# Patient Record
Sex: Female | Born: 1960 | Race: White | Hispanic: No | Smoking: Former smoker
Health system: Southern US, Community
[De-identification: ages and names within clinical notes are randomized; demographics above are authoritative.]

## PROBLEM LIST (undated history)

## (undated) DIAGNOSIS — F329 Major depressive disorder, single episode, unspecified: Secondary | ICD-10-CM

## (undated) DIAGNOSIS — K219 Gastro-esophageal reflux disease without esophagitis: Secondary | ICD-10-CM

## (undated) DIAGNOSIS — I639 Cerebral infarction, unspecified: Secondary | ICD-10-CM

## (undated) DIAGNOSIS — I509 Heart failure, unspecified: Secondary | ICD-10-CM

## (undated) DIAGNOSIS — J449 Chronic obstructive pulmonary disease, unspecified: Secondary | ICD-10-CM

## (undated) DIAGNOSIS — R079 Chest pain, unspecified: Secondary | ICD-10-CM

## (undated) DIAGNOSIS — F419 Anxiety disorder, unspecified: Secondary | ICD-10-CM

## (undated) DIAGNOSIS — F32A Depression, unspecified: Secondary | ICD-10-CM

## (undated) DIAGNOSIS — E785 Hyperlipidemia, unspecified: Secondary | ICD-10-CM

## (undated) DIAGNOSIS — I340 Nonrheumatic mitral (valve) insufficiency: Secondary | ICD-10-CM

## (undated) HISTORY — DX: Anxiety disorder, unspecified: F41.9

## (undated) HISTORY — DX: Chest pain, unspecified: R07.9

## (undated) HISTORY — DX: Cerebral infarction, unspecified: I63.9

## (undated) HISTORY — DX: Nonrheumatic mitral (valve) insufficiency: I34.0

## (undated) HISTORY — DX: Depression, unspecified: F32.A

## (undated) HISTORY — DX: Gastro-esophageal reflux disease without esophagitis: K21.9

## (undated) HISTORY — PX: CORONARY ANGIOPLASTY WITH STENT PLACEMENT: SHX49

## (undated) HISTORY — DX: Major depressive disorder, single episode, unspecified: F32.9

## (undated) HISTORY — PX: TOTAL VAGINAL HYSTERECTOMY: SHX2548

## (undated) HISTORY — DX: Chronic obstructive pulmonary disease, unspecified: J44.9

## (undated) HISTORY — DX: Hyperlipidemia, unspecified: E78.5

## (undated) HISTORY — DX: Heart failure, unspecified: I50.9

---

## 2004-07-28 ENCOUNTER — Ambulatory Visit: Payer: Self-pay | Admitting: Family Medicine

## 2004-11-15 ENCOUNTER — Ambulatory Visit: Payer: Self-pay | Admitting: Family Medicine

## 2005-01-08 ENCOUNTER — Ambulatory Visit: Payer: Self-pay | Admitting: Family Medicine

## 2005-03-02 ENCOUNTER — Ambulatory Visit: Payer: Self-pay | Admitting: Family Medicine

## 2005-03-23 ENCOUNTER — Ambulatory Visit: Payer: Self-pay | Admitting: Family Medicine

## 2005-05-22 ENCOUNTER — Ambulatory Visit: Payer: Self-pay | Admitting: Family Medicine

## 2005-08-06 ENCOUNTER — Ambulatory Visit: Payer: Self-pay | Admitting: Family Medicine

## 2005-08-28 ENCOUNTER — Ambulatory Visit: Payer: Self-pay | Admitting: Family Medicine

## 2005-09-04 ENCOUNTER — Ambulatory Visit: Payer: Self-pay | Admitting: Family Medicine

## 2005-09-06 ENCOUNTER — Ambulatory Visit: Payer: Self-pay | Admitting: Family Medicine

## 2005-09-10 ENCOUNTER — Ambulatory Visit: Payer: Self-pay | Admitting: Family Medicine

## 2005-09-19 ENCOUNTER — Ambulatory Visit: Payer: Self-pay | Admitting: Family Medicine

## 2005-10-16 ENCOUNTER — Ambulatory Visit: Payer: Self-pay | Admitting: Family Medicine

## 2005-10-30 ENCOUNTER — Ambulatory Visit: Payer: Self-pay | Admitting: Family Medicine

## 2007-05-21 ENCOUNTER — Encounter: Admission: RE | Admit: 2007-05-21 | Discharge: 2007-05-21 | Payer: Self-pay | Admitting: Neurology

## 2010-02-23 ENCOUNTER — Ambulatory Visit: Payer: Self-pay | Admitting: Thoracic Surgery (Cardiothoracic Vascular Surgery)

## 2010-09-27 ENCOUNTER — Inpatient Hospital Stay (HOSPITAL_COMMUNITY)
Admission: EM | Admit: 2010-09-27 | Discharge: 2010-09-29 | Payer: Self-pay | Source: Home / Self Care | Attending: Neurology | Admitting: Neurology

## 2010-09-28 LAB — HEMOGLOBIN A1C: Mean Plasma Glucose: 117 mg/dL — ABNORMAL HIGH (ref <117)

## 2010-09-28 LAB — GLUCOSE, CAPILLARY
Glucose-Capillary: 116 mg/dL — ABNORMAL HIGH (ref 70–99)
Glucose-Capillary: 181 mg/dL — ABNORMAL HIGH (ref 70–99)
Glucose-Capillary: 86 mg/dL (ref 70–99)
Glucose-Capillary: 93 mg/dL (ref 70–99)

## 2010-09-28 LAB — MRSA PCR SCREENING: MRSA by PCR: NEGATIVE

## 2010-09-28 LAB — COMPREHENSIVE METABOLIC PANEL
Alkaline Phosphatase: 75 U/L (ref 39–117)
BUN: 7 mg/dL (ref 6–23)
CO2: 28 mEq/L (ref 19–32)
Chloride: 100 mEq/L (ref 96–112)
Creatinine, Ser: 1.09 mg/dL (ref 0.4–1.2)
GFR calc non Af Amer: 53 mL/min — ABNORMAL LOW (ref >60)
Glucose, Bld: 119 mg/dL — ABNORMAL HIGH (ref 70–99)
Total Bilirubin: 0.4 mg/dL (ref 0.3–1.2)

## 2010-09-28 LAB — CBC
MCV: 84 fL (ref 78.0–100.0)
Platelets: 325 10*3/uL (ref 150–400)
RBC: 4.55 MIL/uL (ref 3.87–5.11)
WBC: 11.3 10*3/uL — ABNORMAL HIGH (ref 4.0–10.5)

## 2010-09-28 LAB — DRUGS OF ABUSE SCREEN W/O ALC, ROUTINE URINE
Amphetamine Screen, Ur: NEGATIVE
Benzodiazepines.: NEGATIVE
Marijuana Metabolite: NEGATIVE
Opiate Screen, Urine: NEGATIVE
Phencyclidine (PCP): NEGATIVE
Propoxyphene: NEGATIVE

## 2010-09-28 LAB — PROTIME-INR
INR: 0.93 (ref 0.00–1.49)
Prothrombin Time: 12.7 seconds (ref 11.6–15.2)

## 2010-09-28 LAB — DIFFERENTIAL
Basophils Absolute: 0 10*3/uL (ref 0.0–0.1)
Eosinophils Absolute: 0.2 10*3/uL (ref 0.0–0.7)
Lymphs Abs: 3.3 10*3/uL (ref 0.7–4.0)
Neutrophils Relative %: 61 % (ref 43–77)

## 2010-09-28 LAB — URINALYSIS, ROUTINE W REFLEX MICROSCOPIC
Ketones, ur: NEGATIVE mg/dL
Protein, ur: NEGATIVE mg/dL
Urobilinogen, UA: 0.2 mg/dL (ref 0.0–1.0)

## 2010-09-28 LAB — LIPID PANEL
Cholesterol: 134 mg/dL (ref 0–200)
Total CHOL/HDL Ratio: 3.7 RATIO

## 2010-09-28 LAB — CK TOTAL AND CKMB (NOT AT ARMC): Relative Index: 1.6 (ref 0.0–2.5)

## 2010-09-28 LAB — APTT: aPTT: 26 seconds (ref 24–37)

## 2010-09-28 LAB — POCT I-STAT, CHEM 8
Calcium, Ion: 0.95 mmol/L — ABNORMAL LOW (ref 1.12–1.32)
Chloride: 101 mEq/L (ref 96–112)
HCT: 39 % (ref 36.0–46.0)
Potassium: 2.7 mEq/L — CL (ref 3.5–5.1)

## 2010-09-29 LAB — BASIC METABOLIC PANEL
Chloride: 114 mEq/L — ABNORMAL HIGH (ref 96–112)
GFR calc Af Amer: >60 mL/min (ref >60)
GFR calc non Af Amer: >60 mL/min (ref >60)
Potassium: 3.4 mEq/L — ABNORMAL LOW (ref 3.5–5.1)
Sodium: 144 mEq/L (ref 135–145)

## 2010-09-29 LAB — MAGNESIUM: Magnesium: 1.7 mg/dL (ref 1.5–2.5)

## 2010-09-29 LAB — CBC
MCV: 85.9 fL (ref 78.0–100.0)
Platelets: 248 10*3/uL (ref 150–400)
RBC: 3.55 MIL/uL — ABNORMAL LOW (ref 3.87–5.11)
RDW: 12.9 % (ref 11.5–15.5)
WBC: 8.6 10*3/uL (ref 4.0–10.5)

## 2010-09-29 LAB — GLUCOSE, CAPILLARY
Glucose-Capillary: 84 mg/dL (ref 70–99)
Glucose-Capillary: 104 mg/dL — ABNORMAL HIGH (ref 70–99)

## 2010-10-07 NOTE — Discharge Summary (Signed)
[name], [name]                 ACCOUNT NO.:  [id number]  MEDICAL RECORD NO.:  [id number]          PATIENT TYPE:  INP  LOCATION:  3105                         FACILITY:  [facility]  PHYSICIAN:  Pramod P. Pearlean Brownie, MD    DATE OF BIRTH:  October 26, 1960  DATE OF ADMISSION:  09/27/2010 DATE OF DISCHARGE:  09/29/2010                              DISCHARGE SUMMARY   DIAGNOSES AT TIME OF DISCHARGE: 1. 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 2. Cigarette smoker. 3. Chronic obstructive pulmonary disease. 4. Coronary artery disease status post multiple cardiac stents as well     as open heart surgery in June 2011. 5. Dyslipidemia.  MEDICINES AT TIME OF DISCHARGE: 1. Plavix 75 mg a day. 2. Crestor 20 mg a day. 3. Dexalone 1 capsule a day. 4. Furosemide 40 mg 1 tablet q.a.m., 1/2 tablet q.p.m. 5. Lorazepam 0.5 mg daily as needed for anxiety. 6. Metoprolol XL 50 mg b.i.d. 7. Norvasc 5 mg a day. 8. Potassium 20 mEq 1 tablet a day. 9. Remeron 45 mg 1 tablet at bedtime. 10.Tramadol 50 mg 1-2 tablets q.4 h. p.r.n. pain. 11.Trazodone 50 one tablet daily at bedtime. 12.Zetia 10 mg a day.  STUDIES PERFORMED: 1. CT of the brain on admission shows no acute abnormality.  MRI of     the brain shows right medullary tiny infarct.  Mild white matter     type changes secondary to small vessel disease. 2. MRA of the head shows moderate intracranial vessel branch     irregularity. 3. Followup CT 24 hours after t-PA shows no acute abnormality. 4. 2D echocardiogram shows EF of 50-55% with no obvious source of     embolus. 5. Carotid Doppler shows no ICA stenosis. 6. EKG shows sinus rhythm.  LABORATORY STUDIES:  Magnesium 1.7.  Chemistry with potassium 3.4, chloride 114, BUN 4, creatinine 0.7.  CBC with hemoglobin 10.5, otherwise normal hemoglobin A1c 5.7.  Urine drug screen negative.  MRSA screening negative.  Cholesterol 134, triglycerides 96,  HDL 36, LDL 79. Urinalysis negative.  HISTORY OF PRESENT ILLNESS:  Ms. [name] is a 50 year old right- handed Caucasian female with history of coronary artery disease, COPD, and hyperlipidemia.  She had acute onset of left arm and leg weakness and slurred speech on the morning of that admission at 10:30 p.m.  The patient was awake, watching TV when she felt her left side of her face drawling.  She lives alone.  She called EMS who came to evaluate her and brought her to [facility] for further evaluation.  Upon arrival, CT was negative for acute abnormality.  She was felt to be a candidate for IV t-PA and full-dose IV t-PA was administered.  She was admitted to the neuro ICU for further evaluation.  HOSPITAL COURSE:  MRI of the brain showed an acute very small right medullary infarct felt to be secondary to small vessel disease.  She was on aspirin prior to admission and changed to Plavix for secondary stroke prevention.  She has risk factors, which are managed  by her primary care physician.  No other medication adjustments were made.  She was evaluated by PT, OT, and speech therapy who recommended home health with transition to outpatient neuro rehab.  Arrangements made and the patient will be discharged home.  CONDITION AT TIME OF DISCHARGE:  The patient alert and oriented to person, place, and situation.  She has mild dysarthria with her left facial droop.  Her eye movements are full.  Her face has progressed to be symmetric.  By time of discharge, she has no drift in her extremities, no focal weakness with direct strength testing though she does have a slight ataxic gait.  Plantars are down bilaterally.  DISCHARGE PLAN: 1. Discharge home alone. 2. Home health PT, OT, and social worker with planned transition to     outpatient neuro rehab as needed. 3. Plavix for secondary stroke prevention. 4. Ongoing risk factor control by primary care physician. 5. Follow up  primary care physician within 1 month. 6. Follow up Dr. Pearlean Brownie in his office in 2-3 months.     Annie Main, N.P.   ______________________________ Sunny Schlein. Pearlean Brownie, MD    SB/MEDQ  D:  09/29/2010  T:  09/30/2010  Job:  [id number]  cc:   Molly Maduro L. Lorita Officer., MD  Electronically Signed by Annie Main N.P. on 10/02/2010 81:19:14 PM Electronically Signed by Delia Heady MD on 10/07/2010 01:13:28 PM

## 2010-11-06 NOTE — H&P (Signed)
[name], [name]                 ACCOUNT NO.:  [id number]  MEDICAL RECORD NO.:  [id number]          PATIENT TYPE:  INP  LOCATION:  3105                         FACILITY:  [facility]  PHYSICIAN:  Joycelyn Schmid, MD   DATE OF BIRTH:  13-Sep-1960  DATE OF ADMISSION:  09/27/2010 DATE OF DISCHARGE:                             HISTORY & PHYSICAL   CHIEF COMPLAINT:  Left-sided weakness.  CODE STROKE LOG:  Last seen normal 2230.  Code stroke activated 2347. The patient arrival 2355.  Initial ED physician examination 2355. Stroke Team arrival 0008.  Initial NIH stroke scale 5.  Repeat NIH stroke scale prior to t-PA 4.  Neurology initial examination 0040.  IV t- PA was given at 0045.  HISTORY OF PRESENT ILLNESS:  This is a 50 year old female with history of coronary artery disease, COPD, hyperlipidemia, with new onset of left arm and leg weakness and slurred speech starting at 10:30 p.m.  The patient was awake, watching TV when she felt her left side of her face drawing up.  She lives alone.  She called the paramedics who came to evaluate her, brought her to the [facility] for further evaluation.  The patient had focal neurologic deficits.  No contraindications for IV t-PA.  Risks and benefits were explained the patient.  Full dose t-PA was given.  PAST MEDICAL HISTORY: 1. COPD. 2. Coronary artery disease status post multiple cardiac stents as well     as open heart bypass surgery in June 2011. 3. Hyperlipidemia.  MEDICATIONS:  The patient takes aspirin, Crestor, Toprol.  Additional medications; we will get the patient's home medication list and add to the chart later.  ALLERGIES: 1. SCOPOLAMINE. 2. FLAGYL.  FAMILY HISTORY:  Father deceased from stroke.  Mother deceased from cancer.  SOCIAL HISTORY:  She lives alone.  She smokes 1 pack cigarettes per day for the past 30 years.  Denies alcohol or illicit drug use.  REVIEW OF SYSTEMS:  Weakness, numbness, slurred  speech as per HPI. Denies chest pain, shortness of breath, abdominal pain, nausea, vomiting, constipation, diarrhea, fevers, or chills.  Other review of systems are negative.  PHYSICAL EXAMINATION:  VITAL SIGNS:  Temperature 98.7, blood pressure 144/87, heart rate 105, respirations 22, and O2 sat 98%. GENERAL:  Awake and alert.  Language is fluent.  Comprehension intact. She has mild dysarthria.  Pupils reactive and are 2 mm.  Visual fields full to confrontation. Extraocular muscles intact.  Facial sensation and strength are symmetric.  Uvula midline.  Shoulder shrug symmetric. Tongue is midline.  Motor examination:  Normal bulk and tone with the right upper and right lower extremity 5/5 strength.  In the left upper and left lower extremity, she has drift of the left arm and left leg. She has decreased strength throughout the left arm and left leg ranging from 2-3/5.  Sensory examination:  Decreased pinprick sensation on the left arm, symmetric in the face and legs.  Temperature and vibration symmetric.  Coordination testing:  Finger-nose-finger in the right upper extremity and right lower extremity normal.  Left upper and left lower extremity limited by weakness.  Reflexes 1+ throughout except for left biceps, brachioradialis slightly increased to 2. CARDIOVASCULAR:  Regular rate and rhythm.  No murmurs.  No carotid bruits.  My total initial NIH stroke scale prior to IV t-PA is 4; one point for left arm drift, one point for left leg drift, one point for decreased sensation, and one point for dysarthria.  LABORATORY TESTING:  Sodium 139, potassium 2.7, BUN 8, creatinine 1.3, and glucose 117.  White count 11.3 and platelets 325.  PT 12.7, INR of 0.9, and PTT 26.  CT scan of head which I reviewed shows no evidence of intracerebral hemorrhage.  No early ischemic changes.  ASSESSMENT AND RECOMMENDATIONS:  Ms. Hulett is a 50 year old female with coronary artery disease, chronic  obstructive pulmonary disease, hyperlipidemia, smoking with new focal left arm and leg weakness with slurred speech.  Suspect small vessel thrombosis resulting in stroke in the right subcortical region, right pons or medullary region.  The patient is status post full dose IV t-PA.  PLAN: 1. Admit to neuro ICU 3100. 2. Strict blood pressure control. 3. CT scan of head within 24 hours of IV t-PA. 4. Fasting lipid profile and lipid control. 5. Transthoracic echocardiogram, carotid ultrasound, MRI, MRA of the     head. 6. Replace potassium. 7. Stroke order set completed.  I spent 1 hour in evaluation of this critically ill patient, supervised administration of IV t-PA as well as bringing the patient to neuro ICU. I have reviewed imaging studies, labs, records myself and discussed the care and coordination with other healthcare providers.     Joycelyn Schmid, MD     VP/MEDQ  D:  09/28/2010  T:  09/28/2010  Job:  [id number]  Electronically Signed by Joycelyn Schmid  on 11/06/2010 12:20:25 PM

## 2012-11-29 ENCOUNTER — Telehealth: Payer: Self-pay | Admitting: *Deleted

## 2012-11-29 NOTE — Telephone Encounter (Signed)
Spoke to the patient to cancel appointment on 12-10-12. Will call patient back to reschedule next week

## 2012-12-02 ENCOUNTER — Encounter: Payer: Self-pay | Admitting: *Deleted

## 2012-12-02 NOTE — Telephone Encounter (Signed)
Left message on patient voicemail stating that her new appointment is going to 12-23-12 at 115 with CM. If patient can not make this appointment she needs to call the office and r/s.

## 2012-12-02 NOTE — Telephone Encounter (Signed)
error 

## 2012-12-10 ENCOUNTER — Ambulatory Visit: Payer: Self-pay | Admitting: Nurse Practitioner

## 2012-12-30 ENCOUNTER — Encounter: Payer: Self-pay | Admitting: Nurse Practitioner

## 2012-12-30 ENCOUNTER — Ambulatory Visit (INDEPENDENT_AMBULATORY_CARE_PROVIDER_SITE_OTHER): Payer: Medicaid Other | Admitting: Nurse Practitioner

## 2012-12-30 DIAGNOSIS — I6529 Occlusion and stenosis of unspecified carotid artery: Secondary | ICD-10-CM

## 2012-12-30 DIAGNOSIS — I6350 Cerebral infarction due to unspecified occlusion or stenosis of unspecified cerebral artery: Secondary | ICD-10-CM

## 2012-12-30 NOTE — Progress Notes (Signed)
HPI: Patient returns for followup after her last visit 06/12/2012. [name] Risk factors at that time were smoking she has now quit,  Hypertension, heart disease and hyperlipidemia. She has had no further stroke or TIA symptoms since last seen, she has not fallen. 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 She is to begin cardiac rehabilitation. No new neurologic complaints   ROS:  weight gain, memory, numbness, anxiety, decreased engery  Physical Exam General: well developed, well nourished, seated, in no evident distress Head: head normocephalic and atraumatic. Oropharynx benign Neck: supple with no carotid or supraclavicular bruits Cardiovascular: regular rate and rhythm, no murmurs, CABG scar mid chest  Neurologic Exam Mental Status: Awake and fully alert. Oriented to place and time. Recent and remote memory intact. Attention span, concentration and fund of knowledge appropriate. Mood and affect appropriate.  Cranial Nerves: Fundoscopic exam reveals sharp disc margins. Pupils equal, briskly reactive to light. Extraocular movements full without nystagmus. Visual fields full to confrontation. Hearing intact and symmetric to finger snap. Facial sensation intact. Face, tongue, palate move normally and symmetrically. Neck flexion and extension normal.  Motor: Normal bulk and tone. Normal strength in all tested extremity muscles.No focal weakness, no drift Sensory.: intact to touch and pinprick and vibratory.  Coordination: Rapid alternating movements normal in all extremities. Finger-to-nose and heel-to-shin performed accurately bilaterally. Gait and Station: Arises from chair without  difficulty. Stance is normal. Gait demonstrates normal stride length and balance . Able to heel, toe and unsteady with tandem walk .  Reflexes: 2+ and symmetric. Toes downgoing.     ASSESSMENT: History of right medullary infarct in January 2012 likely from small vessel disease treated with IV TPA with good recovery. Risk factors of smoking, hypertension, heart disease and hyperlipidemia.     PLAN: Continue Plavix for secondary stroke prevention Strict control of blood pressure with systolic goal below 130 Control of lipids with LDL goal below 100 Congratulated on continued smoking cessation Will repeat  carotid Doppler in Jan 2015 given a copy of most recent Doppler study done 09/26/2012 which was consistent with 50-69% stenosis involving the left proximal ICA.  Followup in 6 months  Nilda Riggs, [facility] APRN

## 2012-12-30 NOTE — Patient Instructions (Addendum)
Continue Plavix for secondary stroke prevention Strict control of blood pressure with systolic goal below 130 Control of lipids with LDL goal below 100 Congratulated on continued smoking cessation Will repeat TCD and carotid Doppler in Jan 2015 given a copy of most recent Doppler study done 09/26/2012 Followup in 6 months

## 2013-02-16 ENCOUNTER — Other Ambulatory Visit: Payer: Self-pay

## 2013-02-16 MED ORDER — CLOPIDOGREL BISULFATE 75 MG PO TABS: 75.0000 mg | ORAL_TABLET | Freq: Every day | ORAL | Status: DC

## 2013-07-01 ENCOUNTER — Ambulatory Visit (INDEPENDENT_AMBULATORY_CARE_PROVIDER_SITE_OTHER): Payer: Medicare Other | Admitting: Nurse Practitioner

## 2013-07-01 ENCOUNTER — Encounter: Payer: Self-pay | Admitting: Nurse Practitioner

## 2013-07-01 DIAGNOSIS — I6529 Occlusion and stenosis of unspecified carotid artery: Secondary | ICD-10-CM

## 2013-07-01 NOTE — Patient Instructions (Signed)
Will schedule for Doppler and TEE and January Continue Plavix for secondary stroke prevention Strict control of blood pressure with systolic goal below 130 LDL goal below 100 Followup in 6 months

## 2013-07-01 NOTE — Progress Notes (Signed)
[facility]  PATIENT: [name] DOB: [birth year]   REASON FOR VISIT: Followup for right medullary infarct January 2012   HISTORY OF PRESENT ILLNESS: [name], 52 year old female returns for followup .[name] Risk factors at that time were smoking she has now quit, Hypertension, heart disease and hyperlipidemia. She has had no further stroke or TIA symptoms since last seen, she has not fallen. 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 She has concluded cardiac rehabilitation and is now going to cardiac wellness. She remains on Plavix with minimal bruising. She does complain of memory loss, check baseline Mini-Mental Status exam today    REVIEW OF SYSTEMS: Full 14 system review of systems performed and notable only for:  Constitutional: N/A  Cardiovascular: Palpitations  Ear/Nose/Throat: N/A  Skin: N/A  Eyes: N/A  Respiratory: Shortness of breath Gastroitestinal: N/A  Hematology/Lymphatic: Easy bruising Endocrine: N/A Musculoskeletal:N/A  Allergy/Immunology: N/A  Neurological: Memory loss Psychiatric depression, anxiety, decreased energy, disinterest in activities  ALLERGIES: Allergies  Allergen Reactions  . Flagyl [Metronidazole] Rash  . Scopolamine   . Zithromax [Azithromycin] Rash    HOME MEDICATIONS: Outpatient Prescriptions Prior to Visit  Medication Sig Dispense Refill  . albuterol (PROVENTIL HFA;VENTOLIN HFA) 108 (90 BASE) MCG/ACT inhaler Inhale 2 puffs into the lungs every 6 (six) hours as needed for wheezing.      Marland Kitchen aspirin (ASPIR-81) 81 MG EC tablet Take 81 mg by mouth daily. Swallow whole.      . clopidogrel (PLAVIX) 75 MG tablet Take  1 tablet (75 mg total) by mouth daily.  90 tablet  1  . diltiazem (CARDIZEM) 120 MG tablet Take 120 mg by mouth daily.      . formoterol (FORADIL AEROLIZER) 12 MCG capsule for inhaler Place 12 mcg into inhaler and inhale 2 (two) times daily.      . furosemide (LASIX) 40 MG tablet Take 40 mg by mouth daily. One tab po every am and 1/2 tab in the pm      . METOPROLOL TARTRATE PO Take by mouth.      . niacin (NIASPAN) 500 MG CR tablet Take 500 mg by mouth at bedtime.      . potassium chloride (KLOR-CON) 20 MEQ packet Take 20 mEq by mouth daily.      . rosuvastatin (CRESTOR) 20 MG tablet Take 20 mg by mouth daily.      Marland Kitchen tiotropium (SPIRIVA HANDIHALER) 18 MCG inhalation capsule Place 18 mcg into inhaler and inhale daily.      . traZODone (DESYREL) 50 MG tablet Take 50 mg by mouth at bedtime.      Marland Kitchen LORazepam (ATIVAN) 0.5 MG tablet Take 0.5 mg by mouth as needed for anxiety.      . mirtazapine (REMERON) 45 MG tablet Take 45 mg by mouth at bedtime.      Marland Kitchen omeprazole (PRILOSEC) 20 MG capsule Take 20 mg by mouth daily.       No facility-administered medications prior to visit.    PAST MEDICAL HISTORY: Past Medical History  Diagnosis Date  . Depression   . Anxiety   . COPD (chronic obstructive pulmonary disease)   . Hyperlipemia   . GERD (gastroesophageal reflux disease)   .  MI (mitral incompetence)   . CHF (congestive heart failure)   . Stroke     PAST SURGICAL HISTORY: Past Surgical History  Procedure Laterality Date  . Total vaginal hysterectomy    . Coronary angioplasty with stent placement      FAMILY HISTORY: History reviewed. No pertinent family history.  SOCIAL HISTORY: History   Social History  . Marital Status: Divorced    Spouse Name: N/A    Number of Children: N/A  . Years of Education: N/A   Occupational History  . Not on file.   Social History Main Topics  . Smoking status: Former Games developer  . Smokeless tobacco: Never Used  . Alcohol Use: No  . Drug Use: No    . Sexual Activity: Not on file   Other Topics Concern  . Not on file   Social History Narrative  . No narrative on file     PHYSICAL EXAM  Filed Vitals:   07/01/13 1418  BP: 109/73  Pulse: 78  Height: 5\' 4"  (1.626 m)  Weight: 148 lb (67.132 kg)   Body mass index is 25.39 kg/(m^2).  Generalized: Well developed, in no acute distress  Head: normocephalic and atraumatic,. Oropharynx benign  Neck: Supple, no carotid bruits  Cardiac: Regular rate rhythm, no murmur , CABG scar mid chest Neurological examination   Mentation: Alert oriented to time, place, history taking. MMSE 29/30 missing 1 recall item. Follows all commands speech and language fluent  Cranial nerve II-XII: Fundoscopic exam reveals sharp disc margins.Pupils were equal round reactive to light extraocular movements were full, visual field were full on confrontational test. Facial sensation and strength were normal. hearing was intact to finger rubbing bilaterally. Uvula tongue midline. head turning and shoulder shrug and were normal and symmetric.Tongue protrusion into cheek strength was normal. Motor: normal bulk and tone, full strength in the BUE, BLE, fine finger movements normal, no pronator drift. No focal weakness Sensory: normal and symmetric to light touch, pinprick, and  vibration  Coordination: finger-nose-finger, heel-to-shin bilaterally, no dysmetria Reflexes: Brachioradialis 2/2, biceps 2/2, triceps 2/2, patellar 2/2, Achilles 2/2, plantar responses were flexor bilaterally. Gait and Station: Rising up from seated position without assistance, normal stance, moderate stride, good arm swing, smooth turning, able to perform tiptoe, and heel walking without difficulty. Unsteady with tandem walk.  DIAGNOSTIC DATA (LABS, IMAGING, TESTING) - None to review   ASSESSMENT AND PLAN  52 y.o. year old female  has a past medical history of Depression; Anxiety; COPD (chronic obstructive pulmonary disease);  Hyperlipemia;  MI (mitral incompetence); CHF (congestive heart failure); and Stroke. here to follow up.   Will schedule for Doppler and TCD for  January Continue Plavix for secondary stroke prevention Strict control of blood pressure with systolic goal below 130 LDL goal below 100 Followup in 6 months Nilda Riggs, [facility], [facility], APRN  [facility] [address], [address] [place], [place] [address] [phone]

## 2013-09-06 ENCOUNTER — Other Ambulatory Visit: Payer: Self-pay

## 2013-09-06 MED ORDER — CLOPIDOGREL BISULFATE 75 MG PO TABS: 75.0000 mg | ORAL_TABLET | Freq: Every day | ORAL | Status: DC

## 2013-09-08 ENCOUNTER — Ambulatory Visit (INDEPENDENT_AMBULATORY_CARE_PROVIDER_SITE_OTHER): Payer: Medicare Other

## 2013-09-08 DIAGNOSIS — Z0289 Encounter for other administrative examinations: Secondary | ICD-10-CM

## 2013-09-08 DIAGNOSIS — I6529 Occlusion and stenosis of unspecified carotid artery: Secondary | ICD-10-CM

## 2013-09-10 ENCOUNTER — Telehealth: Payer: Self-pay | Admitting: Nurse Practitioner

## 2013-09-10 NOTE — Telephone Encounter (Signed)
TC to patient with results of doppler. Left message that results were negative for significant stenosis.

## 2013-09-11 ENCOUNTER — Telehealth: Payer: Self-pay | Admitting: Nurse Practitioner

## 2013-09-11 NOTE — Telephone Encounter (Signed)
Called patient and shared normal TCD results, she verbalized understanding

## 2013-09-11 NOTE — Telephone Encounter (Signed)
Please let pt know TCD was normal

## 2013-12-03 ENCOUNTER — Other Ambulatory Visit: Payer: Self-pay

## 2013-12-03 MED ORDER — CLOPIDOGREL BISULFATE 75 MG PO TABS: 75.0000 mg | ORAL_TABLET | Freq: Every day | ORAL | Status: DC

## 2013-12-30 ENCOUNTER — Ambulatory Visit (INDEPENDENT_AMBULATORY_CARE_PROVIDER_SITE_OTHER): Payer: Medicare Other | Admitting: Neurology

## 2013-12-30 ENCOUNTER — Encounter: Payer: Self-pay | Admitting: Neurology

## 2013-12-30 ENCOUNTER — Encounter (INDEPENDENT_AMBULATORY_CARE_PROVIDER_SITE_OTHER): Payer: Self-pay

## 2013-12-30 VITALS — BP 114/70 | HR 85 | Ht 63.0 in | Wt 149.0 lb

## 2013-12-30 DIAGNOSIS — R413 Other amnesia: Secondary | ICD-10-CM

## 2013-12-30 NOTE — Patient Instructions (Signed)
I had a long discussion the patient with regards to her cerebrovascular disease and mild memory loss and answered questions.Both appear to be stable at present time. Continue Plavix for stroke prevention and strict control of hypertension with blood pressure goal below 130/90 lipids with LDL cholesterol goal below 100 mg percent. I advised her to do next with the exercises to help with discussed with and neck pain and headache. Return for followup in 6 months with Darrol Angelarolyn Martin, NP. Or call earlier if necessary.

## 2013-12-31 NOTE — Progress Notes (Addendum)
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 Risk factors at that time were smoking she has now quit, Hypertension, heart disease and hyperlipidemia. She has had no further stroke or TIA symptoms since last seen, she has not fallen. 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 She has concluded cardiac rehabilitation and is now going to cardiac wellness. She remains on Plavix with minimal bruising. She does complain of memory loss, check baseline Mini-Mental Status exam today Update 12/30/2013 : She returns for followup last visit 6 months ago. She continues to well without recurrent stroke or TIA symptoms. She is tolerating Plavix well without bleeding bruising or other side effects. She states her blood pressure is quite well controlled and it was 114/70 office today. Her last lipid profile was a month ago and was fine and checked by a cardiologist. She also states her memory difficulties are unchanged. She had carotid ultrasound and transcranial doppler studies done on 09/08/13 which were both unremarkable. She feels her balance has not yet returned back to normal but she walks quite well with a cane which he uses only for low dose. She has some mild intermittent posterior neck pain off and off but this is not bothersome.   REVIEW OF SYSTEMS: Full 14 system review of systems performed and notable only for:  No complaints ALLERGIES: Allergies  Allergen Reactions  . Flagyl [Metronidazole] Rash  . Scopolamine    . Zithromax [Azithromycin] Rash    HOME MEDICATIONS: Outpatient Prescriptions Prior to Visit  Medication Sig Dispense Refill  . albuterol (PROVENTIL HFA;VENTOLIN HFA) 108 (90 BASE) MCG/ACT inhaler Inhale 2 puffs into the lungs every 6 (six) hours as needed for wheezing.      Marland Kitchen aspirin (ASPIR-81) 81 MG EC tablet Take 81 mg by mouth daily. Swallow whole.      . clonazePAM (KLONOPIN) 0.5 MG tablet Take 0.5 mg by mouth 2 (two) times daily as needed for anxiety.      . clopidogrel (PLAVIX) 75 MG tablet Take 1 tablet (75 mg total) by mouth daily.  90 tablet  0  . diltiazem (CARDIZEM) 120 MG tablet Take 120 mg by mouth daily.      Marland Kitchen esomeprazole (NEXIUM) 20 MG capsule Take 20 mg by mouth daily before breakfast.      . formoterol (FORADIL AEROLIZER) 12 MCG capsule for inhaler Place 12 mcg into inhaler and inhale 2 (two) times daily.      . furosemide (LASIX) 40 MG tablet Take 40 mg by mouth daily. One tab po every am and 1/2 tab in the pm      . METOPROLOL TARTRATE PO Take by mouth.      . niacin (NIASPAN) 500 MG CR tablet Take 500 mg by mouth at bedtime.      . nitroGLYCERIN (NITROSTAT) 0.4 MG SL tablet Place 0.4 mg under the tongue every 5 (five) minutes as needed for chest pain.      . potassium chloride (KLOR-CON) 20 MEQ packet Take 20 mEq by mouth daily.      . ranolazine (  RANEXA) 500 MG 12 hr tablet Take 500 mg by mouth 2 (two) times daily.      . rosuvastatin (CRESTOR) 20 MG tablet Take 20 mg by mouth daily.      Marland Kitchen. tiotropium (SPIRIVA HANDIHALER) 18 MCG inhalation capsule Place 18 mcg into inhaler and inhale daily.      . traZODone (DESYREL) 50 MG tablet Take 50 mg by mouth at bedtime.      . sertraline (ZOLOFT) 50 MG tablet Take 50 mg by mouth 3 (three) times daily.       No facility-administered medications prior to visit.    PAST MEDICAL HISTORY: Past Medical History  Diagnosis Date  . Depression   . Anxiety   . COPD (chronic obstructive pulmonary disease)   . Hyperlipemia   .  GERD (gastroesophageal reflux disease)   . MI (mitral incompetence)   . CHF (congestive heart failure)   . Stroke     PAST SURGICAL HISTORY: Past Surgical History  Procedure Laterality Date  . Total vaginal hysterectomy    . Coronary angioplasty with stent placement      FAMILY HISTORY: Family History  Problem Relation Age of Onset  . Cancer Mother   . Stroke Father     SOCIAL HISTORY: History   Social History  . Marital Status: Divorced    Spouse Name: N/A    Number of Children: 1  . Years of Education: GED   Occupational History  .     Social History Main Topics  . Smoking status: Former Games developermoker  . Smokeless tobacco: Never Used  . Alcohol Use: No  . Drug Use: No  . Sexual Activity: Not Currently   Other Topics Concern  . Not on file   Social History Narrative  . No narrative on file     PHYSICAL EXAM  Filed Vitals:   12/30/13 1417  BP: 114/70  Pulse: 85  Height: 5\' 3"  (1.6 m)  Weight: 149 lb (67.586 kg)   Body mass index is 26.4 kg/(m^2).  Generalized: Well developed, in no acute distress  Head: normocephalic and atraumatic,. Oropharynx benign  Neck: Supple, no carotid bruits  Cardiac: Regular rate rhythm, no murmur , CABG scar mid chest Mild kyphosis Neurological examination   Mentation: Alert oriented to time, place, history taking.   Follows all commands speech and language fluent  Cranial nerve II-XII: Fundoscopic exam reveals sharp disc margins.Pupils were equal round reactive to light extraocular movements were full, visual field were full on confrontational test. Facial sensation and strength were normal. hearing was intact to finger rubbing bilaterally. Uvula tongue midline. head turning and shoulder shrug and were normal and symmetric.Tongue protrusion into cheek strength was normal. Motor: normal bulk and tone, full strength in the BUE, BLE, fine finger movements normal, no pronator drift. No focal weakness Sensory: normal and symmetric  to light touch, pinprick, and  vibration  Coordination: finger-nose-finger, heel-to-shin bilaterally, no dysmetria Reflexes: Brachioradialis 2/2, biceps 2/2, triceps 2/2, patellar 2/2, Achilles 2/2, plantar responses were flexor bilaterally. Gait and Station: Rising up from seated position without assistance, normal stance, moderate stride, good arm swing, smooth turning, able to perform tiptoe, and heel walking without difficulty. Unsteady with tandem walk.  DIAGNOSTIC DATA (LABS, IMAGING, TESTING) - None to review   ASSESSMENT AND PLAN  53 y.o. year old female  has a past medical history of Depression; Anxiety; COPD (chronic obstructive pulmonary disease); Hyperlipemia;  MI (mitral incompetence); CHF (congestive heart failure); and Stroke. here to follow  up.   I had a long discussion the patient with regards to her cerebrovascular disease and mild memory loss and answered questions.Both appear to be stable at present time. Continue Plavix for stroke prevention and strict control of hypertension with blood pressure goal below 130/90 lipids with LDL cholesterol goal below 100 mg percent. I advised her to do next with the exercises to help with discussed with and neck pain and headache. Return for followup in 6 months with Darrol Angelarolyn Martin, NP. Or call earlier if necessary. Delia HeadyPramod Deloria Brassfield, MD  [facility] [address], [address] [place], [place] [address] [phone]

## 2014-03-03 ENCOUNTER — Other Ambulatory Visit: Payer: Self-pay

## 2014-03-03 MED ORDER — CLOPIDOGREL BISULFATE 75 MG PO TABS: 75.0000 mg | ORAL_TABLET | Freq: Every day | ORAL | Status: DC

## 2014-07-06 ENCOUNTER — Ambulatory Visit (INDEPENDENT_AMBULATORY_CARE_PROVIDER_SITE_OTHER): Payer: PRIVATE HEALTH INSURANCE | Admitting: Nurse Practitioner

## 2014-07-06 ENCOUNTER — Encounter: Payer: Self-pay | Admitting: Nurse Practitioner

## 2014-07-06 VITALS — BP 105/66 | HR 75 | Ht 64.5 in | Wt 139.0 lb

## 2014-07-06 DIAGNOSIS — I639 Cerebral infarction, unspecified: Secondary | ICD-10-CM

## 2014-07-06 DIAGNOSIS — R413 Other amnesia: Secondary | ICD-10-CM

## 2014-07-06 DIAGNOSIS — I6350 Cerebral infarction due to unspecified occlusion or stenosis of unspecified cerebral artery: Secondary | ICD-10-CM

## 2014-07-06 NOTE — Patient Instructions (Signed)
Continue Plavix for stroke prevention  Strict control of hypertension with blood pressure goal below 130/90 todays reading 105/66 Lipids with LDL cholesterol goal below 100 mg percent. Memory score is stable F/U in 1 year

## 2014-07-06 NOTE — Progress Notes (Signed)
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 Risk factors at that time were smoking she has now quit, Hypertension, heart disease and hyperlipidemia. She has had no further stroke or TIA symptoms since last seen, she has not fallen. Her most recent carotid Dopplers on 09/08/2013 was negative for significant stenosis involving the extracranial carotid arteries.She denies double vision, loss of vision, focal weakness, numbness, speech or swallowing problems, confusions, blackouts.  She remains on Plavix with minimal bruising. She does complain of memory loss, will check  Mini-Mental Status exam today. She has a history of depression and sees a psychiatrist Update 12/30/2013 : She returns for followup last visit 6 months ago. She continues to well without recurrent stroke or TIA symptoms. She is tolerating Plavix well without bleeding bruising or other side effects. She states her blood pressure is quite well controlled and it was 114/70 office today. Her last lipid profile was a month ago and was fine and checked by a cardiologist. She also states her memory difficulties are unchanged. She had carotid ultrasound and transcranial doppler studies done on 09/08/13 which were both unremarkable. She feels her balance has not yet returned back to normal but she walks quite well with a cane which he uses only for low dose. She has some mild intermittent posterior neck pain off and off but this is not bothersome.  REVIEW OF SYSTEMS: Full 14 system review of systems performed and notable only for those listed, all others are neg:  Constitutional: N/A  Cardiovascular: N/A  Ear/Nose/Throat: N/A  Skin: N/A  Eyes: N/A  Respiratory:  N/A  Gastroitestinal: N/A  Hematology/Lymphatic: N/A  Endocrine: N/A Musculoskeletal:N/A  Allergy/Immunology: N/A  Neurological: N/A Psychiatric: N/A Sleep : NA   ALLERGIES: Allergies  Allergen Reactions  . Flagyl [Metronidazole] Rash  . Scopolamine   . Zithromax [Azithromycin] Rash    HOME MEDICATIONS: Outpatient Prescriptions Prior to Visit  Medication Sig Dispense Refill  . albuterol (PROVENTIL HFA;VENTOLIN HFA) 108 (90 BASE) MCG/ACT inhaler Inhale 2 puffs into the lungs every 6 (six) hours as needed for wheezing.    Marland Kitchen aspirin (ASPIR-81) 81 MG EC tablet Take 81 mg by mouth daily. Swallow whole.    . clonazePAM (KLONOPIN) 0.5 MG tablet Take 0.5 mg by mouth 2 (two) times daily as needed for anxiety.    . clopidogrel (PLAVIX) 75 MG tablet Take 1 tablet (75 mg total) by mouth daily. 90 tablet 1  . diltiazem (CARDIZEM) 120 MG tablet Take 120 mg by mouth daily.    Marland Kitchen esomeprazole (NEXIUM) 20 MG capsule Take 20 mg by mouth daily before breakfast.    . furosemide (LASIX) 40 MG tablet Take 40 mg by mouth daily. One tab po every am and 1/2 tab in the pm    . METOPROLOL TARTRATE PO Take 50 mg by mouth 2 (two) times daily.     . nitroGLYCERIN (NITROSTAT) 0.4 MG SL tablet Place 0.4 mg under the tongue every 5 (five) minutes as needed for chest pain.    . potassium chloride (KLOR-CON) 20 MEQ packet Take 20 mEq by mouth daily.    . potassium chloride SA (K-DUR,KLOR-CON) 20 MEQ tablet Take 20 mEq by mouth daily.    Marland Kitchen  ranolazine (RANEXA) 500 MG 12 hr tablet Take 500 mg by mouth 2 (two) times daily.    . rosuvastatin (CRESTOR) 20 MG tablet Take 20 mg by mouth daily.    Marland Kitchen. tiotropium (SPIRIVA HANDIHALER) 18 MCG inhalation capsule Place 18 mcg into inhaler and inhale daily.    . traZODone (DESYREL) 50 MG tablet Take 50 mg by mouth at bedtime.    Marland Kitchen. venlafaxine (EFFEXOR) 37.5 MG tablet Take 37.5 mg by mouth 2 (two) times daily.    . formoterol (FORADIL AEROLIZER) 12 MCG capsule for inhaler Place 12  mcg into inhaler and inhale 2 (two) times daily.    . niacin (NIASPAN) 500 MG CR tablet Take 500 mg by mouth at bedtime.     No facility-administered medications prior to visit.    PAST MEDICAL HISTORY: Past Medical History  Diagnosis Date  . Depression   . Anxiety   . COPD (chronic obstructive pulmonary disease)   . Hyperlipemia   . GERD (gastroesophageal reflux disease)   . MI (mitral incompetence)   . CHF (congestive heart failure)   . Stroke     PAST SURGICAL HISTORY: Past Surgical History  Procedure Laterality Date  . Total vaginal hysterectomy    . Coronary angioplasty with stent placement      FAMILY HISTORY: Family History  Problem Relation Age of Onset  . Cancer Mother   . Stroke Father     SOCIAL HISTORY: History   Social History  . Marital Status: Divorced    Spouse Name: N/A    Number of Children: 1  . Years of Education: GED   Occupational History  .     Social History Main Topics  . Smoking status: Former Games developermoker  . Smokeless tobacco: Never Used  . Alcohol Use: No  . Drug Use: No  . Sexual Activity: Not Currently   Other Topics Concern  . Not on file   Social History Narrative   Patient lives at home alone.    Patient has 1 child.    Patient has a GED.    Patient is right handed.    Patient is divorced.      PHYSICAL EXAM  Filed Vitals:   07/06/14 1434  BP: 105/66  Pulse: 75  Height: 5' 4.5" (1.638 m)  Weight: 139 lb (63.05 kg)   Body mass index is 23.5 kg/(m^2). Generalized: Well developed, in no acute distress  Head: normocephalic and atraumatic,. Oropharynx benign  Neck: Supple, no carotid bruits  Cardiac: Regular rate rhythm, no murmur , CABG scar mid chest Mild kyphosis Neurological examination   Mentation: Alert oriented to time, place, history taking. Follows all commands speech and language fluent.MMSE 28/30. AFt 11.   Cranial nerve II-XII: Fundoscopic exam reveals sharp disc margins.Pupils were equal round  reactive to light extraocular movements were full, visual field were full on confrontational test. Facial sensation and strength were normal. hearing was intact to finger rubbing bilaterally. Uvula tongue midline. head turning and shoulder shrug and were normal and symmetric.Tongue protrusion into cheek strength was normal. Motor: normal bulk and tone, full strength in the BUE, BLE, fine finger movements normal, no pronator drift. No focal weakness Sensory: normal and symmetric to light touch, pinprick, and vibration  Coordination: finger-nose-finger, heel-to-shin bilaterally, no dysmetria Reflexes: Brachioradialis 2/2, biceps 2/2, triceps 2/2, patellar 2/2, Achilles 2/2, plantar responses were flexor bilaterally. Gait and Station: Rising up from seated position without assistance, normal stance, moderate stride, good arm swing, smooth turning, able to  perform tiptoe, and heel walking without difficulty. Unsteady with tandem walk.  DIAGNOSTIC DATA (LABS, IMAGING, TESTING) - I reviewed patient records, labs, notes, testing and imaging myself where available.    ASSESSMENT AND PLAN  53 y.o. year old female  has a past medical history of Depression; Anxiety; COPD (chronic obstructive pulmonary disease); Hyperlipemia; GERD (gastroesophageal reflux disease); MI (mitral incompetence); CHF (congestive heart failure); and Stroke. here to follow up. Carotid Dopplers on 09/08/2013 was negative for significant stenosis involving the extracranial carotid arteries.The patient is a current patient of Dr. Pearlean BrownieSethi  who is out of the office today . This note is sent to the work in doctor.      Continue Plavix for stroke prevention  Strict control of hypertension with blood pressure goal below 130/90 todays reading 105/66 Lipids with LDL cholesterol goal below 100 mg percent.Followed by PCP Memory score is stable F/U in 1 year Nilda RiggsNancy Carolyn Reichen Hutzler, [facility] HospitalGNP, [facility], APRN  [facility] [address],  [address] [place], [place] [address] [phone]

## 2014-08-30 ENCOUNTER — Other Ambulatory Visit: Payer: Self-pay | Admitting: Neurology

## 2014-09-29 NOTE — Progress Notes (Signed)
I reviewed above note and agree with the assessment and plan.  Marvel PlanJindong Jermiah Soderman, MD PhD Stroke Neurology 09/29/2014 12:43 PM

## 2015-01-02 DIAGNOSIS — R079 Chest pain, unspecified: Secondary | ICD-10-CM

## 2015-01-02 HISTORY — DX: Chest pain, unspecified: R07.9

## 2015-01-13 ENCOUNTER — Inpatient Hospital Stay: Admit: 2015-01-13 | Payer: Self-pay

## 2015-07-12 ENCOUNTER — Encounter: Payer: Self-pay | Admitting: Nurse Practitioner

## 2015-07-12 ENCOUNTER — Ambulatory Visit (INDEPENDENT_AMBULATORY_CARE_PROVIDER_SITE_OTHER): Payer: Medicare Other | Admitting: Nurse Practitioner

## 2015-07-12 VITALS — BP 110/69 | HR 76 | Ht 62.5 in | Wt 141.6 lb

## 2015-07-12 DIAGNOSIS — I6522 Occlusion and stenosis of left carotid artery: Secondary | ICD-10-CM

## 2015-07-12 DIAGNOSIS — R413 Other amnesia: Secondary | ICD-10-CM

## 2015-07-12 DIAGNOSIS — I6350 Cerebral infarction due to unspecified occlusion or stenosis of unspecified cerebral artery: Secondary | ICD-10-CM | POA: Diagnosis not present

## 2015-07-12 MED ORDER — CLOPIDOGREL BISULFATE 75 MG PO TABS: 75.0000 mg | ORAL_TABLET | Freq: Every day | ORAL | Status: DC

## 2015-07-12 NOTE — Progress Notes (Signed)
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 Risk factors at that time were smoking she has now quit, Hypertension, heart disease and hyperlipidemia. She has had no further stroke or TIA symptoms since last seen.  Her most recent carotid Dopplers on 09/08/2013 was negative for significant stenosis involving the extracranial carotid arteries.She denies double vision, loss of vision, focal weakness, numbness, speech or swallowing problems, confusions, blackouts. She remains on Plavix with minimal bruising. She does complain of memory loss, MMSE has been stable over time.  She has a history of depression. She had an admission for congestive heart failure in May of this year. She is on oxygen at night. She denies any recent falls. She returns for reevaluation   REVIEW OF SYSTEMS: Full 14 system review of systems performed and notable only for those listed, all others are neg:  Constitutional: neg  Cardiovascular: neg Ear/Nose/Throat: neg  Skin: neg Eyes: neg Respiratory: neg Gastroitestinal: neg  Hematology/Lymphatic: neg  Endocrine: neg Musculoskeletal:neg Allergy/Immunology: neg Neurological: History of stroke, memory loss Psychiatric: Anxiety Sleep : neg   ALLERGIES: Allergies  Allergen Reactions  . Flagyl [Metronidazole] Rash  . Scopolamine   . Zithromax [Azithromycin] Rash    HOME MEDICATIONS: Outpatient Prescriptions Prior to Visit  Medication Sig Dispense Refill  . albuterol (PROVENTIL HFA;VENTOLIN HFA) 108 (90 BASE) MCG/ACT inhaler Inhale 2 puffs into the lungs every 6 (six) hours as needed for wheezing.    Marland Kitchen.  aspirin (ASPIR-81) 81 MG EC tablet Take 81 mg by mouth daily. Swallow whole.    . Cetirizine HCl 10 MG CAPS Take 10 mg by mouth daily.    . clonazePAM (KLONOPIN) 0.5 MG tablet Take 0.5 mg by mouth 2 (two) times daily as needed for anxiety.    . clopidogrel (PLAVIX) 75 MG tablet TAKE 1 TABLET ONCE DAILY. 90 tablet 3  . diltiazem (CARDIZEM) 120 MG tablet Take 120 mg by mouth daily.    Marland Kitchen. esomeprazole (NEXIUM) 20 MG capsule Take 20 mg by mouth daily before breakfast.    . furosemide (LASIX) 40 MG tablet Take 40 mg by mouth 2 (two) times daily. One tab po every am and 1/2 tab in the pm    . METOPROLOL TARTRATE PO Take 50 mg by mouth 2 (two) times daily.     . mometasone (ELOCON) 0.1 % cream Apply 1 application topically 3 (three) times daily.    . montelukast (SINGULAIR) 10 MG tablet Take 10 mg by mouth at bedtime.    . nitroGLYCERIN (NITROSTAT) 0.4 MG SL tablet Place 0.4 mg under the tongue every 5 (five) minutes as needed for chest pain.    . potassium chloride (KLOR-CON) 20 MEQ packet Take 20 mEq by mouth daily.    . potassium chloride SA (K-DUR,KLOR-CON) 20 MEQ tablet Take 20 mEq by mouth 2 (two) times daily.     . ranolazine (RANEXA) 500 MG 12 hr tablet Take 500 mg by mouth 2 (two) times daily.    . rosuvastatin (CRESTOR) 20 MG tablet Take 20 mg by mouth daily.    . traZODone (DESYREL) 50 MG tablet Take 50 mg by mouth at bedtime.    .Marland Kitchen  venlafaxine (EFFEXOR) 37.5 MG tablet Take 37.5 mg by mouth 2 (two) times daily.    Marland Kitchen ketoconazole (NIZORAL) 2 % cream Apply 1 application topically daily.    Marland Kitchen tiotropium (SPIRIVA HANDIHALER) 18 MCG inhalation capsule Place 18 mcg into inhaler and inhale daily.     No facility-administered medications prior to visit.    PAST MEDICAL HISTORY: Past Medical History  Diagnosis Date  . Depression   . Anxiety   . COPD (chronic obstructive pulmonary disease) (HCC)   . Hyperlipemia   . GERD (gastroesophageal reflux disease)   . MI (mitral incompetence)   . CHF  (congestive heart failure) (HCC)   . Stroke (HCC)   . Chest pain 01/2015    PAST SURGICAL HISTORY: Past Surgical History  Procedure Laterality Date  . Total vaginal hysterectomy    . Coronary angioplasty with stent placement      FAMILY HISTORY: Family History  Problem Relation Age of Onset  . Cancer Mother   . Stroke Father     SOCIAL HISTORY: Social History   Social History  . Marital Status: Divorced    Spouse Name: N/A  . Number of Children: 1  . Years of Education: GED   Occupational History  .     Social History Main Topics  . Smoking status: Former Games developer  . Smokeless tobacco: Never Used  . Alcohol Use: No  . Drug Use: No  . Sexual Activity: Not Currently   Other Topics Concern  . Not on file   Social History Narrative   Patient lives at home alone.    Patient has 1 child.    Patient has a GED.    Patient is right handed.    Patient is divorced.      PHYSICAL EXAM  Filed Vitals:   07/12/15 1448  BP: 110/69  Pulse: 76  Height: 5' 2.5" (1.588 m)  Weight: 141 lb 9.6 oz (64.229 kg)   Body mass index is 25.47 kg/(m^2). Generalized: Well developed, in no acute distress  Head: normocephalic and atraumatic,. Oropharynx benign  Neck: Supple, no carotid bruits  Cardiac: Regular rate rhythm, no murmur , CABG scar mid chest Mild kyphosis Neurological examination   Mentation: Alert oriented to time, place, history taking. Follows all commands speech and language fluent.MMSE 26/30. AFt 6. Clock drawing 4/4   Cranial nerve II-XII: Fundoscopic exam reveals sharp disc margins.Pupils were equal round reactive to light extraocular movements were full, visual field were full on confrontational test. Facial sensation and strength were normal. hearing was intact to finger rubbing bilaterally. Uvula tongue midline. head turning and shoulder shrug and were normal and symmetric.Tongue protrusion into cheek strength was normal. Motor: normal bulk and tone, full  strength in the BUE, BLE, fine finger movements normal, no pronator drift. No focal weakness Sensory: normal and symmetric to light touch, pinprick, and vibration  Coordination: finger-nose-finger, heel-to-shin bilaterally, no dysmetria Reflexes: Brachioradialis 2/2, biceps 2/2, triceps 2/2, patellar 2/2, Achilles 2/2, plantar responses were flexor bilaterally. Gait and Station: Rising up from seated position without assistance, normal stance, moderate stride, good arm swing, smooth turning, able to perform tiptoe, and heel walking without difficulty. Unsteady with tandem walk. DIAGNOSTIC DATA (LABS, IMAGING, TESTING) - ASSESSMENT AND PLAN  [name]  has a past medical history of Depression; Anxiety; COPD (chronic obstructive pulmonary disease) (HCC); Hyperlipemia;  MI (mitral incompetence); CHF (congestive heart failure) (HCC); Stroke [facility]); and Chest pain (01/2015). here to follow-up Carotid Dopplers on 09/08/2013  was negative for significant stenosis involving the extracranial carotid arteries.  Continue Plavix for stroke prevention  Strict control of hypertension with blood pressure goal below 130/90 todays reading 110//69 Lipids with LDL cholesterol goal below 100 mg percent,.Followed by cardiology Memory score is stable F/U in 1 year Nilda Riggs, [facility], [facility], APRN  [facility] [address] [place], [place] [address] [phone]

## 2015-07-12 NOTE — Patient Instructions (Signed)
Continue Plavix for stroke prevention  Strict control of hypertension with blood pressure goal below 130/90 todays reading 110/69 Lipids with LDL cholesterol goal below 100 mg percent.Followed by PCP Memory score is stable F/U in 1 year

## 2015-07-12 NOTE — Progress Notes (Signed)
I agree with the above plan 

## 2015-12-06 DIAGNOSIS — I25119 Atherosclerotic heart disease of native coronary artery with unspecified angina pectoris: Secondary | ICD-10-CM | POA: Insufficient documentation

## 2015-12-06 DIAGNOSIS — I10 Essential (primary) hypertension: Secondary | ICD-10-CM | POA: Insufficient documentation

## 2015-12-29 DIAGNOSIS — M810 Age-related osteoporosis without current pathological fracture: Secondary | ICD-10-CM | POA: Insufficient documentation

## 2016-01-23 DIAGNOSIS — N3000 Acute cystitis without hematuria: Secondary | ICD-10-CM | POA: Insufficient documentation

## 2016-07-11 ENCOUNTER — Ambulatory Visit (INDEPENDENT_AMBULATORY_CARE_PROVIDER_SITE_OTHER): Payer: Medicare Other | Admitting: Nurse Practitioner

## 2016-07-11 ENCOUNTER — Encounter: Payer: Self-pay | Admitting: Nurse Practitioner

## 2016-07-11 VITALS — BP 111/66 | HR 78 | Ht 62.5 in | Wt 146.6 lb

## 2016-07-11 DIAGNOSIS — I10 Essential (primary) hypertension: Secondary | ICD-10-CM | POA: Diagnosis not present

## 2016-07-11 DIAGNOSIS — R413 Other amnesia: Secondary | ICD-10-CM | POA: Diagnosis not present

## 2016-07-11 DIAGNOSIS — I6350 Cerebral infarction due to unspecified occlusion or stenosis of unspecified cerebral artery: Secondary | ICD-10-CM

## 2016-07-11 DIAGNOSIS — E785 Hyperlipidemia, unspecified: Secondary | ICD-10-CM | POA: Diagnosis not present

## 2016-07-11 MED ORDER — CLOPIDOGREL BISULFATE 75 MG PO TABS: 75.0000 mg | ORAL_TABLET | Freq: Every day | ORAL | 3 refills | Status: DC

## 2016-07-11 NOTE — Patient Instructions (Addendum)
Continue Plavix for stroke prevention will refill Strict control of hypertension with blood pressure goal below 130/90 todays reading 111/66 Lipids with LDL cholesterol goal below 100 mg percent,.Followed by Dr. Dulce SellarMunley cardiology continue Crestor Memory score is stable Will check carotid Doppler, follow-up from Doppler 09/08/2013 F/U in 1 year next with Dr. Pearlean BrownieSethi

## 2016-07-11 NOTE — Progress Notes (Signed)
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 Risk factors at that time were smoking she has now quit, Hypertension, heart disease and hyperlipidemia. She has had no further stroke or TIA symptoms since last seen.  She is currently on Plavix without bruising or bleeding Her most recent carotid Dopplers on 09/08/2013 was negative for significant stenosis involving the extracranial carotid arteries.She denies double vision, loss of vision, focal weakness, numbness, speech or swallowing problems, confusions, blackouts.  She does complain of memory loss, MMSE has been stable over time.  She has a history of depression with recent changes to her antidepressant. She had an admission for congestive heart failure in May 2016. She is on oxygen at night. She denies any recent falls. She remains independent in activities of daily living. She continues to drive without difficulty .She returns for reevaluation   REVIEW OF SYSTEMS: Full 14 system review of systems performed and notable only for those listed, all others are neg:  Constitutional: neg  Cardiovascular: neg Ear/Nose/Throat: neg  Skin: neg Eyes: neg Respiratory: neg Gastroitestinal: neg  Hematology/Lymphatic: neg  Endocrine: neg Musculoskeletal:neg Allergy/Immunology: neg Neurological: History of stroke, memory loss Psychiatric: Anxiety Sleep : neg   ALLERGIES: Allergies  Allergen Reactions  . Flagyl [Metronidazole] Rash  . Scopolamine   . Zithromax [Azithromycin] Rash  . Ace Inhibitors     Caused Bp to bottom out  . Clindamycin Rash    Uncertain  . Erythromycin Base Rash      HOME MEDICATIONS: Outpatient Medications Prior to Visit  Medication Sig Dispense Refill  . albuterol (PROVENTIL HFA;VENTOLIN HFA) 108 (90 BASE) MCG/ACT inhaler Inhale 2 puffs into the lungs every 6 (six) hours as needed for wheezing.    Marland Kitchen. aspirin (ASPIR-81) 81 MG EC tablet Take 81 mg by mouth daily. Swallow whole.    . Cetirizine HCl 10 MG CAPS Take 10 mg by mouth daily.    . clonazePAM (KLONOPIN) 0.5 MG tablet Take 0.5 mg by mouth 2 (two) times daily as needed for anxiety.    . clopidogrel (PLAVIX) 75 MG tablet Take 1 tablet (75 mg total) by mouth daily. 90 tablet 3  . diltiazem (CARDIZEM) 120 MG tablet Take 120 mg by mouth daily.    . furosemide (LASIX) 40 MG tablet Take 40 mg by mouth 2 (two) times daily. One tab po every am and 1/2 tab in the pm    . METOPROLOL TARTRATE PO Take 50 mg by mouth 2 (two) times daily.     . montelukast (SINGULAIR) 10 MG tablet Take 10 mg by mouth at bedtime.    . nitroGLYCERIN (NITROSTAT) 0.4 MG SL tablet Place 0.4 mg under the tongue every 5 (five) minutes as needed for chest pain.    . potassium chloride SA (K-DUR,KLOR-CON) 20 MEQ tablet Take 20 mEq by mouth 2 (two) times daily.     Marland Kitchen. PRESCRIPTION MEDICATION Oxygen 2 liters at night.    . ranolazine (RANEXA) 500 MG 12 hr tablet Take 500 mg by mouth 2 (two) times daily.    . rosuvastatin (CRESTOR) 20 MG tablet Take 20 mg by mouth daily.    . traZODone (DESYREL) 50  MG tablet Take 50 mg by mouth at bedtime.    . clopidogrel (PLAVIX) 75 MG tablet Take 1 tablet (75 mg total) by mouth daily. 90 tablet 3  . esomeprazole (NEXIUM) 20 MG capsule Take 20 mg by mouth daily before breakfast.    . mometasone (ELOCON) 0.1 % cream Apply 1 application topically 3 (three) times daily.    . potassium chloride (KLOR-CON) 20 MEQ packet Take 20 mEq by mouth daily.    Marland Kitchen venlafaxine (EFFEXOR) 37.5 MG tablet Take 37.5 mg by mouth 2 (two) times daily.     No facility-administered medications prior to visit.     PAST MEDICAL  HISTORY: Past Medical History:  Diagnosis Date  . Anxiety   . Chest pain 01/2015  . CHF (congestive heart failure) (HCC)   . COPD (chronic obstructive pulmonary disease) (HCC)   . Depression   . GERD (gastroesophageal reflux disease)   . Hyperlipemia   . MI (mitral incompetence)   . Stroke [facility])     PAST SURGICAL HISTORY: Past Surgical History:  Procedure Laterality Date  . CORONARY ANGIOPLASTY WITH STENT PLACEMENT    . TOTAL VAGINAL HYSTERECTOMY      FAMILY HISTORY: Family History  Problem Relation Age of Onset  . Cancer Mother   . Stroke Father     SOCIAL HISTORY: Social History   Social History  . Marital status: Divorced    Spouse name: N/A  . Number of children: 1  . Years of education: GED   Occupational History  .  Unemployed   Social History Main Topics  . Smoking status: Former Games developer  . Smokeless tobacco: Never Used  . Alcohol use No  . Drug use: No  . Sexual activity: Not Currently   Other Topics Concern  . Not on file   Social History Narrative   Patient lives at home alone.    Patient has 1 child.    Patient has a GED.    Patient is right handed.    Patient is divorced.      PHYSICAL EXAM  Vitals:   07/11/16 1106  Weight: 146 lb 9.6 oz (66.5 kg)  Height: 5' 2.5" (1.588 m)   Body mass index is 26.39 kg/m. Generalized: Well developed, in no acute distress  Head: normocephalic and atraumatic,. Oropharynx benign  Neck: Supple, no carotid bruits  Cardiac: Regular rate rhythm, no murmur , CABG scar mid chest Mild kyphosis Neurological examination   Mentation: Alert oriented to time, place, history taking. Follows all commands speech and language fluent.MMSE 26/30. AFt 7. Clock drawing 4/4  MMSE - Mini Mental State Exam 07/11/2016 07/12/2015 07/06/2014  Orientation to time 3 4 4   Orientation to Place 5 5 5   Registration 3 3 3   Attention/ Calculation 5 4 5   Recall 1 1 2   Language- name 2 objects 2 2 2   Language- repeat 1 1 1     Language- follow 3 step command 3 3 3   Language- read & follow direction 1 1 1   Write a sentence 1 1 1   Copy design 1 1 1   Total score 26 26 28     Cranial nerve II-XII: Fundoscopic exam reveals sharp disc margins.Pupils were equal round reactive to light extraocular movements were full, visual field were full on confrontational test. Facial sensation and strength were normal. hearing was intact to finger rubbing bilaterally. Uvula tongue midline. head turning and shoulder shrug and were normal and symmetric.Tongue protrusion into cheek strength was normal. Motor: normal bulk  and tone, full strength in the BUE, BLE, fine finger movements normal, no pronator drift. No focal weakness Sensory: normal and symmetric to light touch, pinprick, and vibration in the upper and lower extremities  Coordination: finger-nose-finger, heel-to-shin bilaterally, no dysmetria Reflexes: Brachioradialis 2/2, biceps 2/2, triceps 2/2, patellar 2/2, Achilles 2/2, plantar responses were flexor bilaterally. Gait and Station: Rising up from seated position without assistance, normal stance, moderate stride, good arm swing, smooth turning, able to perform tiptoe, and heel walking without difficulty. Unsteady with tandem walk. No assistive device DIAGNOSTIC DATA (LABS, IMAGING, TESTING) - ASSESSMENT AND PLAN  55 y.o. year old female  has a past medical history of Depression; Anxiety; COPD (chronic obstructive pulmonary disease) (HCC); Hyperlipemia;  MI (mitral incompetence); CHF (congestive heart failure) (HCC); Stroke [facility](HCC); and Chest pain (01/2015). here to follow-up Carotid Dopplers on 09/08/2013 was negative for significant stenosis involving the extracranial carotid arteries.  PLAN:Continue Plavix for stroke prevention will refill Strict control of hypertension with blood pressure goal below 130/90 todays reading 111/66 Lipids with LDL cholesterol goal below 100 mg percent,.Followed by Dr. Dulce SellarMunley cardiology continue  Crestor Memory score is stable Will check carotid Doppler, follow-up from Doppler 09/08/2013 F/U in 1 year next with Dr. Jarvis NewcomerSethi  Nancy Carolyn Martin, [facility] LLCGNP, [facility], APRN  [facility] [address], [address] [place], [place] [id number] [phone]

## 2016-07-12 NOTE — Progress Notes (Signed)
I agree with the above plan 

## 2016-09-19 ENCOUNTER — Other Ambulatory Visit: Payer: Medicare Other

## 2016-09-27 ENCOUNTER — Other Ambulatory Visit: Payer: Medicare Other

## 2016-10-04 ENCOUNTER — Ambulatory Visit (INDEPENDENT_AMBULATORY_CARE_PROVIDER_SITE_OTHER): Payer: Medicare Other

## 2016-10-04 DIAGNOSIS — I6350 Cerebral infarction due to unspecified occlusion or stenosis of unspecified cerebral artery: Secondary | ICD-10-CM

## 2016-10-04 DIAGNOSIS — I10 Essential (primary) hypertension: Secondary | ICD-10-CM

## 2016-10-08 ENCOUNTER — Telehealth: Payer: Self-pay | Admitting: Nurse Practitioner

## 2016-10-08 NOTE — Telephone Encounter (Signed)
Spoke to pt and relayed the carotid doppler study results as per below.   She verbalized understanding.

## 2016-10-08 NOTE — Telephone Encounter (Signed)
Carotid Doppler negative for hemodynamically significant stenosis bilaterally. Please call the patient

## 2017-02-12 DIAGNOSIS — I700 Atherosclerosis of aorta: Secondary | ICD-10-CM | POA: Insufficient documentation

## 2017-03-08 ENCOUNTER — Telehealth: Payer: Self-pay | Admitting: Neurology

## 2017-03-08 NOTE — Telephone Encounter (Signed)
Left vm at [phone] ext [phone] for staff to call back. Left vm that Dr. Pearlean BrownieSethi is out of office till Monday. Rn wanted to know can Dr. Pearlean BrownieSethi address this issue on Monday. Left [facility] contact information.

## 2017-03-08 NOTE — Telephone Encounter (Signed)
Message sent to Dr. Pearlean BrownieSethi for review for clearance. This needs to be done on Monday 03/11/2017. asap see phone notes.

## 2017-03-08 NOTE — Telephone Encounter (Signed)
Pt calling to inform that she is in need of a Epidural steroid injection for hurniated disc that is pinching off a nerve. [facility]([phone]) the Dr Loralie Champagneurrani is wanting pt to stop plavix 5 days before the procedure which would need to start 03-10-2017, pt needs to know if Dr Pearlean BrownieSethi will okay please call

## 2017-03-08 NOTE — Telephone Encounter (Signed)
Kim from the [facility] office has called back to inform that it really can not wait because pt is scheduled for the Epidural steroid injection on Friday the 13th of next week, she said pt may have to miss dosage on Sunday and call back on Monday, please call

## 2017-03-11 NOTE — Telephone Encounter (Signed)
Rn call Selena BattenKim at [facility]. Rn gave Selena BattenKim the clearance for epidural injection in back. Rn stated Dr.Sethi was call via telephone to get clearance recommendations. Rn stated per Dr. Pearlean BrownieSethi pt may hold plavix 5 days prior to procedure with a small but acceptable peri-procedural risk of TIA/Stroke. Pt can resume plavix after the procedure when safe.Kim verbalized understanding of clearance instructions. Selena BattenKim will call the patient with the recommendations.

## 2017-07-11 ENCOUNTER — Ambulatory Visit (INDEPENDENT_AMBULATORY_CARE_PROVIDER_SITE_OTHER): Payer: Medicare Other | Admitting: Neurology

## 2017-07-11 ENCOUNTER — Encounter: Payer: Self-pay | Admitting: Neurology

## 2017-07-11 VITALS — BP 98/68 | HR 76 | Wt 130.0 lb

## 2017-07-11 DIAGNOSIS — I6990 Unspecified sequelae of unspecified cerebrovascular disease: Secondary | ICD-10-CM | POA: Diagnosis not present

## 2017-07-11 NOTE — Progress Notes (Signed)
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 Risk factors at that time were smoking she has now quit, Hypertension, heart disease and hyperlipidemia. She has had no further stroke or TIA symptoms since last seen.  She is currently on Plavix without bruising or bleeding Her most recent carotid Dopplers on 09/08/2013 was negative for significant stenosis involving the extracranial carotid arteries.She denies double vision, loss of vision, focal weakness, numbness, speech or swallowing problems, confusions, blackouts.  She does complain of memory loss, MMSE has been stable over time.  She has a history of depression with recent changes to her antidepressant. She had an admission for congestive heart failure in May 2016. She is on oxygen at night. She denies any recent falls. She remains independent in activities of daily living. She continues to drive without difficulty .She returns for reevaluation  Update 07/11/2017 : She returns for follow-up after last visit a year ago.  She continues to do well without recurrent stroke or TIA symptoms.  She remains on Plavix which she is tolerating well without bleeding or bruising.  Her blood pressure usually tends to run low and today is 98/6 8.  She remains on Crestor which is tolerating well but lipid profile checked 3 months agoBy her cardiologist was not satisfactory and hence he has started her on Repatha as well.  She had follow-up carotid ultrasound done on 10/15/16 which showed no significant extracranial carotid stenosis.  She continues to have mild short-term memory difficulties but this is not  progressive.  She has learned to cope and organize herself.  She is independent in activities of daily living.  She complains of occasional hand tremors but these may be related to anxiety.  She states her depression and anxiety are well controlled.     REVIEW OF SYSTEMS: Full 14 system review of systems performed and notable only for those listed, all others are neg:  Anxiety, nervousness, tremors, headache, all other systems negative  ALLERGIES: Allergies  Allergen Reactions  . Metronidazole Rash and Hives  . Scopolamine   . Zithromax [Azithromycin] Rash  . Ace Inhibitors Other (See Comments)    Caused Bp to bottom out hypotension   . Scopolamine Hbr Other (See Comments)  . Clindamycin Rash    Uncertain  . Erythromycin Base Rash    HOME MEDICATIONS: Outpatient Medications Prior to Visit  Medication Sig Dispense Refill  . albuterol (PROVENTIL HFA;VENTOLIN HFA) 108 (90 BASE) MCG/ACT inhaler Inhale 2 puffs into the lungs every 6 (six) hours as needed for wheezing.    . Albuterol Sulfate (PROAIR RESPICLICK) 108 (90 Base) MCG/ACT AEPB INHALE 2 PUFFS 4 TIMES DAILY AS NEEDED FOR SHORTNESS OF BREATH.    Marland Kitchen aspirin (ASPIR-81) 81 MG EC tablet Take 81 mg by mouth daily. Swallow whole.    . betamethasone dipropionate (DIPROLENE) 0.05 % ointment     . Calcium Carb-Cholecalciferol (CALCIUM 600 + D PO) Take by mouth.    . Cetirizine HCl 10 MG CAPS Take 10 mg by mouth daily.    . clonazePAM (KLONOPIN) 0.5 MG tablet Take 0.5 mg by mouth 2 (two) times daily as needed for anxiety.    Marland Kitchen  clopidogrel (PLAVIX) 75 MG tablet Take 1 tablet (75 mg total) by mouth daily. 90 tablet 3  . diltiazem (CARDIZEM) 120 MG tablet Take 120 mg by mouth daily.    Marland Kitchen. esomeprazole (NEXIUM) 40 MG capsule Take 40 mg by mouth daily at 12 noon.     . Evolocumab 140 MG/ML SOAJ Inject 140 mg into the skin.     Marland Kitchen. ezetimibe (ZETIA) 10 MG tablet Take 10 mg daily by mouth.    . furosemide (LASIX) 40 MG tablet Take 40 mg by mouth 2  (two) times daily. One tab po every am and 1/2 tab in the pm    . METOPROLOL TARTRATE PO Take 50 mg by mouth 2 (two) times daily.     . montelukast (SINGULAIR) 10 MG tablet Take 10 mg by mouth at bedtime.    . nitroGLYCERIN (NITROSTAT) 0.4 MG SL tablet Place 0.4 mg under the tongue every 5 (five) minutes as needed for chest pain.    . potassium chloride SA (K-DUR,KLOR-CON) 20 MEQ tablet Take 20 mEq by mouth 2 (two) times daily.     Marland Kitchen. PRESCRIPTION MEDICATION Oxygen 2 liters at night.    Marland Kitchen. PROLIA 60 MG/ML SOLN injection Inject 60 mg into the skin every 6 (six) months.     . ranolazine (RANEXA) 500 MG 12 hr tablet Take 500 mg by mouth 2 (two) times daily.    . rosuvastatin (CRESTOR) 20 MG tablet Take 20 mg by mouth daily.    . traZODone (DESYREL) 50 MG tablet Take 50 mg by mouth at bedtime.    . TRINTELLIX 10 MG TABS Once daily    . valACYclovir (VALTREX) 1000 MG tablet Take 1,000 mg by mouth.    Marland Kitchen. VITAMIN D, CHOLECALCIFEROL, PO Take 25,000 Units by mouth.    . clonazePAM (KLONOPIN) 0.5 MG tablet Take 2 tablets daily as needed    . montelukast (SINGULAIR) 10 MG tablet TAKE 1 TABLET BY MOUTH DAILY AT BEDTIME    . traZODone (DESYREL) 50 MG tablet Take 1 tablet by oral route at night for sleep     No facility-administered medications prior to visit.     PAST MEDICAL HISTORY: Past Medical History:  Diagnosis Date  . Anxiety   . Chest pain 01/2015  . CHF (congestive heart failure) (HCC)   . COPD (chronic obstructive pulmonary disease) (HCC)   . Depression   . GERD (gastroesophageal reflux disease)   . Hyperlipemia   . MI (mitral incompetence)   . Stroke [facility](HCC)     PAST SURGICAL HISTORY: Past Surgical History:  Procedure Laterality Date  . CORONARY ANGIOPLASTY WITH STENT PLACEMENT    . TOTAL VAGINAL HYSTERECTOMY      FAMILY HISTORY: Family History  Problem Relation Age of Onset  . Cancer Mother   . Stroke Father     SOCIAL HISTORY: Social History   Socioeconomic History  .  Marital status: Divorced    Spouse name: Not on file  . Number of children: 1  . Years of education: GED  . Highest education level: Not on file  Social Needs  . Financial resource strain: Not on file  . Food insecurity - worry: Not on file  . Food insecurity - inability: Not on file  . Transportation needs - medical: Not on file  . Transportation needs - non-medical: Not on file  Occupational History    Employer: UNEMPLOYED  Tobacco Use  . Smoking status: Former Games developermoker  . Smokeless tobacco: Never Used  Substance and Sexual Activity  . Alcohol use: No  . Drug use: No  . Sexual activity: Not Currently  Other Topics Concern  . Not on file  Social History Narrative   Patient lives at home alone.    Patient has 1 child.    Patient has a GED.    Patient is right handed.    Patient is divorced.      PHYSICAL EXAM  Vitals:   07/11/17 1140  BP: 98/68  Pulse: 76  Weight: 130 lb (59 kg)   Body mass index is 23.4 kg/m. Generalized: Well developed, in no acute distress  Head: normocephalic and atraumatic,. Oropharynx benign  Neck: Supple, no carotid bruits  Cardiac: Regular rate rhythm, no murmur , CABG scar mid chest Mild kyphosis Neurological examination   Mentation: Alert oriented to time, place, history taking. Follows all commands speech and language fluent.Recall 1/3.  Able to name only 7 animals with 4 legs.  Mini-Mental status not performed today  MMSE - Mini Mental State Exam 07/11/2016 07/12/2015 07/06/2014  Orientation to time 3 4 4   Orientation to Place 5 5 5   Registration 3 3 3   Attention/ Calculation 5 4 5   Recall 1 1 2   Language- name 2 objects 2 2 2   Language- repeat 1 1 1   Language- follow 3 step command 3 3 3   Language- read & follow direction 1 1 1   Write a sentence 1 1 1   Copy design 1 1 1   Total score 26 26 28     Cranial nerve II-XII: Fundoscopic exam not done.Pupils were equal round reactive to light extraocular movements were full, visual  field were full on confrontational test. Facial sensation and strength were normal. hearing was intact to finger rubbing bilaterally.  Motor: normal bulk and tone, full strength in the BUE, BLE, fine finger movements normal, no pronator drift. No focal weakness Sensory: normal and symmetric to light touch, pinprick, and vibration in the upper and lower extremities  Coordination: finger-nose-finger, heel-to-shin bilaterally, no dysmetria Reflexes: Brachioradialis 2/2, biceps 2/2, triceps 2/2, patellar 2/2, Achilles 2/2, plantar responses were flexor bilaterally. Gait and Station: Rising up from seated position without assistance, normal stance, moderate stride, good arm swing, smooth turning, able to perform tiptoe, and heel walking without difficulty. Unsteady with tandem walk. No assistive device DIAGNOSTIC DATA (LABS, IMAGING, TESTING) - ASSESSMENT AND PLAN  56 y.o. year old female  has a past medical history of Depression; Anxiety; COPD (chronic obstructive pulmonary disease) (HCC); Hyperlipemia;  MI (mitral incompetence); CHF (congestive heart failure) (HCC); St[facility](HCC); and Chest pain (01/2015). here to follow-up Carotid Dopplers on 10/15/2016 were negative for significant stenosis involving the extracranial carotid arteries.  PLAN:I had a long d/w patient about her remote stroke, risk for recurrent stroke/TIAs, personally independently reviewed imaging studies and stroke evaluation results and answered questions.Continue aspirin 81 mg daily and Plavixl 75 mg daily  for secondary stroke prevention and maintain strict control of hypertension with blood pressure goal below 130/90, diabetes with hemoglobin A1c goal below 6.5% and lipids with LDL cholesterol goal below 70 mg/dL. I also advised the patient to eat a healthy diet with plenty of whole grains, cereals, fruits and vegetables, exercise regularly and maintain ideal body weight.  I have encouraged the patient to increase participation in  mentally challenging activities like solving crossword puzzles, playing sudoku and bridge to help with her mild cognitive impairment.  Continue Klonopin for anxiety.  Since it has been 5 years since her last  stroke I do not believe further scheduled follow-up appointment with me is necessary.  She may be referred back in the future only as needed.   Delia Heady, MD [facility] [address], [address] [place], [place] [address] [phone]

## 2017-07-11 NOTE — Patient Instructions (Signed)
I had a long d/w patient about her remote stroke, risk for recurrent stroke/TIAs, personally independently reviewed imaging studies and stroke evaluation results and answered questions.Continue aspirin 81 mg daily and Plavixl 75 mg daily  for secondary stroke prevention and maintain strict control of hypertension with blood pressure goal below 130/90, diabetes with hemoglobin A1c goal below 6.5% and lipids with LDL cholesterol goal below 70 mg/dL. I also advised the patient to eat a healthy diet with plenty of whole grains, cereals, fruits and vegetables, exercise regularly and maintain ideal body weight.  I have encouraged the patient to increase participation in mentally challenging activities like solving crossword puzzles, playing sudoku and bridge to help with her mild cognitive impairment.  Continue Klonopin for anxiety.  Since it has been 5 years since her last stroke I do not believe further scheduled follow-up appointment with me is necessary.  She may be referred back in the future only as needed.

## 2017-08-22 ENCOUNTER — Other Ambulatory Visit: Payer: Self-pay | Admitting: Nurse Practitioner

## 2017-08-23 ENCOUNTER — Other Ambulatory Visit: Payer: Self-pay

## 2017-08-23 MED ORDER — CLOPIDOGREL BISULFATE 75 MG PO TABS: 75.0000 mg | ORAL_TABLET | Freq: Every day | ORAL | 0 refills | Status: DC

## 2017-08-29 ENCOUNTER — Other Ambulatory Visit: Payer: Self-pay | Admitting: *Deleted

## 2017-08-29 ENCOUNTER — Telehealth: Payer: Self-pay | Admitting: Neurology

## 2017-08-29 MED ORDER — CLOPIDOGREL BISULFATE 75 MG PO TABS: 75.0000 mg | ORAL_TABLET | Freq: Every day | ORAL | 0 refills | Status: DC

## 2017-08-29 MED ORDER — CLOPIDOGREL BISULFATE 75 MG PO TABS: 75.0000 mg | ORAL_TABLET | Freq: Every day | ORAL | 3 refills | Status: DC

## 2017-08-29 NOTE — Telephone Encounter (Signed)
[facility]Ahern[facility] Marlis EdelsonSethi's absence, and she gave a verbal order for a year's worth of plavix for this pt.  I called pt to discuss, no answer, left a message asking her to call us back. If pt calls back, please advise her of this information.

## 2017-08-29 NOTE — Telephone Encounter (Signed)
Pt calling stating that Dr Pearlean BrownieSethi has released her but she will not see her Cardiologist until March and was hoping to get 1 last refill of clopidogrel (PLAVIX) 75 MG tablet.  Please call

## 2017-08-29 NOTE — Addendum Note (Signed)
Addended by: Geronimo RunningINKINS, Adaria Hole A on: 08/29/2017 03:08 PM   Modules accepted: Orders

## 2017-08-29 NOTE — Telephone Encounter (Signed)
She has not heard from [facility] about getting 90 day plavix, from 08-23-17.  She has always gotten at [facility].  I did place order for 90 days and she will get from CD or pcp from then.  She verbalized understanding.

## 2018-08-07 ENCOUNTER — Telehealth: Payer: Self-pay

## 2018-08-07 NOTE — Telephone Encounter (Signed)
Pt was release back to PCP in 07/2017 by Dr. Pearlean BrownieSEThi. Plavix refills are to be forward to her primary doctor. Thanks

## 2021-06-08 ENCOUNTER — Other Ambulatory Visit: Payer: Self-pay

## 2021-06-08 ENCOUNTER — Encounter (HOSPITAL_COMMUNITY): Payer: Self-pay | Admitting: *Deleted

## 2021-06-08 ENCOUNTER — Emergency Department (HOSPITAL_COMMUNITY): Payer: Medicare Other

## 2021-06-08 ENCOUNTER — Inpatient Hospital Stay (HOSPITAL_COMMUNITY)
Admission: EM | Admit: 2021-06-08 | Discharge: 2021-06-10 | DRG: 062 | Disposition: A | Discharge status: Home / Self Care | Payer: Medicare Other | Attending: Internal Medicine | Admitting: Internal Medicine

## 2021-06-08 DIAGNOSIS — I2510 Atherosclerotic heart disease of native coronary artery without angina pectoris: Secondary | ICD-10-CM | POA: Diagnosis present

## 2021-06-08 DIAGNOSIS — Z881 Allergy status to other antibiotic agents status: Secondary | ICD-10-CM

## 2021-06-08 DIAGNOSIS — N179 Acute kidney failure, unspecified: Secondary | ICD-10-CM | POA: Diagnosis present

## 2021-06-08 DIAGNOSIS — I252 Old myocardial infarction: Secondary | ICD-10-CM

## 2021-06-08 DIAGNOSIS — Z23 Encounter for immunization: Secondary | ICD-10-CM

## 2021-06-08 DIAGNOSIS — Z823 Family history of stroke: Secondary | ICD-10-CM | POA: Diagnosis not present

## 2021-06-08 DIAGNOSIS — Z7982 Long term (current) use of aspirin: Secondary | ICD-10-CM

## 2021-06-08 DIAGNOSIS — Z888 Allergy status to other drugs, medicaments and biological substances status: Secondary | ICD-10-CM

## 2021-06-08 DIAGNOSIS — R110 Nausea: Secondary | ICD-10-CM | POA: Diagnosis not present

## 2021-06-08 DIAGNOSIS — Z20822 Contact with and (suspected) exposure to covid-19: Secondary | ICD-10-CM | POA: Diagnosis present

## 2021-06-08 DIAGNOSIS — Z87891 Personal history of nicotine dependence: Secondary | ICD-10-CM

## 2021-06-08 DIAGNOSIS — I110 Hypertensive heart disease with heart failure: Secondary | ICD-10-CM | POA: Diagnosis present

## 2021-06-08 DIAGNOSIS — Z90710 Acquired absence of both cervix and uterus: Secondary | ICD-10-CM

## 2021-06-08 DIAGNOSIS — I509 Heart failure, unspecified: Secondary | ICD-10-CM | POA: Diagnosis present

## 2021-06-08 DIAGNOSIS — F419 Anxiety disorder, unspecified: Secondary | ICD-10-CM | POA: Diagnosis present

## 2021-06-08 DIAGNOSIS — K219 Gastro-esophageal reflux disease without esophagitis: Secondary | ICD-10-CM | POA: Diagnosis present

## 2021-06-08 DIAGNOSIS — J449 Chronic obstructive pulmonary disease, unspecified: Secondary | ICD-10-CM | POA: Diagnosis present

## 2021-06-08 DIAGNOSIS — Z7902 Long term (current) use of antithrombotics/antiplatelets: Secondary | ICD-10-CM | POA: Diagnosis not present

## 2021-06-08 DIAGNOSIS — Z79899 Other long term (current) drug therapy: Secondary | ICD-10-CM

## 2021-06-08 DIAGNOSIS — G8194 Hemiplegia, unspecified affecting left nondominant side: Secondary | ICD-10-CM | POA: Diagnosis present

## 2021-06-08 DIAGNOSIS — E785 Hyperlipidemia, unspecified: Secondary | ICD-10-CM | POA: Diagnosis present

## 2021-06-08 DIAGNOSIS — F32A Depression, unspecified: Secondary | ICD-10-CM | POA: Diagnosis present

## 2021-06-08 DIAGNOSIS — Z955 Presence of coronary angioplasty implant and graft: Secondary | ICD-10-CM

## 2021-06-08 DIAGNOSIS — I6300 Cerebral infarction due to thrombosis of unspecified precerebral artery: Secondary | ICD-10-CM | POA: Diagnosis not present

## 2021-06-08 DIAGNOSIS — R29704 NIHSS score 4: Secondary | ICD-10-CM | POA: Diagnosis present

## 2021-06-08 DIAGNOSIS — Z8673 Personal history of transient ischemic attack (TIA), and cerebral infarction without residual deficits: Secondary | ICD-10-CM | POA: Diagnosis not present

## 2021-06-08 DIAGNOSIS — G458 Other transient cerebral ischemic attacks and related syndromes: Secondary | ICD-10-CM | POA: Diagnosis present

## 2021-06-08 DIAGNOSIS — I639 Cerebral infarction, unspecified: Secondary | ICD-10-CM | POA: Diagnosis present

## 2021-06-08 DIAGNOSIS — I6389 Other cerebral infarction: Secondary | ICD-10-CM | POA: Diagnosis not present

## 2021-06-08 LAB — I-STAT CHEM 8, ED
BUN: 18 mg/dL (ref 6–20)
Calcium, Ion: 1.14 mmol/L — ABNORMAL LOW (ref 1.15–1.40)
Chloride: 100 mmol/L (ref 98–111)
Creatinine, Ser: 1.8 mg/dL — ABNORMAL HIGH (ref 0.44–1.00)
Glucose, Bld: 120 mg/dL — ABNORMAL HIGH (ref 70–99)
HCT: 42 % (ref 36.0–46.0)
Hemoglobin: 14.3 g/dL (ref 12.0–15.0)
Potassium: 4 mmol/L (ref 3.5–5.1)
Sodium: 139 mmol/L (ref 135–145)
TCO2: 29 mmol/L (ref 22–32)

## 2021-06-08 LAB — CBC WITH DIFFERENTIAL/PLATELET
Abs Immature Granulocytes: 0.03 10*3/uL (ref 0.00–0.07)
Basophils Absolute: 0.1 10*3/uL (ref 0.0–0.1)
Basophils Relative: 1 %
Eosinophils Absolute: 0.2 10*3/uL (ref 0.0–0.5)
Eosinophils Relative: 2 %
HCT: 41.4 % (ref 36.0–46.0)
Hemoglobin: 14.2 g/dL (ref 12.0–15.0)
Immature Granulocytes: 0 %
Lymphocytes Relative: 25 %
Lymphs Abs: 2.2 10*3/uL (ref 0.7–4.0)
MCH: 32.4 pg (ref 26.0–34.0)
MCHC: 34.3 g/dL (ref 30.0–36.0)
MCV: 94.5 fL (ref 80.0–100.0)
Monocytes Absolute: 0.9 10*3/uL (ref 0.1–1.0)
Monocytes Relative: 11 %
Neutro Abs: 5.2 10*3/uL (ref 1.7–7.7)
Neutrophils Relative %: 61 %
Platelets: 367 10*3/uL (ref 150–400)
RBC: 4.38 MIL/uL (ref 3.87–5.11)
RDW: 12.7 % (ref 11.5–15.5)
WBC: 8.5 10*3/uL (ref 4.0–10.5)
nRBC: 0 % (ref 0.0–0.2)

## 2021-06-08 LAB — BASIC METABOLIC PANEL
Anion gap: 10 (ref 5–15)
BUN: 16 mg/dL (ref 6–20)
CO2: 27 mmol/L (ref 22–32)
Calcium: 9.6 mg/dL (ref 8.9–10.3)
Chloride: 101 mmol/L (ref 98–111)
Creatinine, Ser: 1.66 mg/dL — ABNORMAL HIGH (ref 0.44–1.00)
GFR, Estimated: 35 mL/min — ABNORMAL LOW (ref >60)
Glucose, Bld: 123 mg/dL — ABNORMAL HIGH (ref 70–99)
Potassium: 4 mmol/L (ref 3.5–5.1)
Sodium: 138 mmol/L (ref 135–145)

## 2021-06-08 LAB — APTT: aPTT: 25 seconds (ref 24–36)

## 2021-06-08 LAB — I-STAT BETA HCG BLOOD, ED (MC, WL, AP ONLY): I-stat hCG, quantitative: <5 m[IU]/mL (ref <5)

## 2021-06-08 LAB — CBG MONITORING, ED: Glucose-Capillary: 126 mg/dL — ABNORMAL HIGH (ref 70–99)

## 2021-06-08 LAB — PROTIME-INR
INR: 0.9 (ref 0.8–1.2)
Prothrombin Time: 11.8 seconds (ref 11.4–15.2)

## 2021-06-08 LAB — ETHANOL: Alcohol, Ethyl (B): <10 mg/dL (ref <10)

## 2021-06-08 LAB — TROPONIN I (HIGH SENSITIVITY): Troponin I (High Sensitivity): 5 ng/L (ref <18)

## 2021-06-08 MED ORDER — SODIUM CHLORIDE 0.9 % IV SOLN: INTRAVENOUS | Status: AC

## 2021-06-08 MED ORDER — CLEVIDIPINE BUTYRATE 0.5 MG/ML IV EMUL: 0.0000 mg/h | INTRAVENOUS | Status: DC

## 2021-06-08 MED ORDER — TENECTEPLASE FOR STROKE
0.2500 mg/kg | PACK | Freq: Once | INTRAVENOUS | Status: AC
  Administered 2021-06-08: 14 mg via INTRAVENOUS

## 2021-06-08 MED ORDER — PANTOPRAZOLE SODIUM 40 MG IV SOLR
40.0000 mg | Freq: Every day | INTRAVENOUS | Status: DC
  Filled 2021-06-08: qty 40

## 2021-06-08 MED ORDER — CLONAZEPAM 0.5 MG PO TABS
0.5000 mg | ORAL_TABLET | Freq: Two times a day (BID) | ORAL | Status: DC | PRN
  Administered 2021-06-10: 0.5 mg via ORAL
  Filled 2021-06-08: qty 1

## 2021-06-08 MED ORDER — ACETAMINOPHEN 650 MG RE SUPP: 650.0000 mg | RECTAL | Status: DC | PRN

## 2021-06-08 MED ORDER — ACETAMINOPHEN 325 MG PO TABS: 650.0000 mg | ORAL_TABLET | ORAL | Status: DC | PRN

## 2021-06-08 MED ORDER — IOHEXOL 350 MG/ML SOLN
65.0000 mL | Freq: Once | INTRAVENOUS | Status: AC | PRN
  Administered 2021-06-08: 65 mL via INTRAVENOUS

## 2021-06-08 MED ORDER — ACETAMINOPHEN 160 MG/5ML PO SOLN: 650.0000 mg | ORAL | Status: DC | PRN

## 2021-06-08 MED ORDER — EZETIMIBE 10 MG PO TABS
10.0000 mg | ORAL_TABLET | Freq: Every day | ORAL | Status: DC
  Administered 2021-06-09 – 2021-06-10 (×2): 10 mg via ORAL
  Filled 2021-06-08 (×2): qty 1

## 2021-06-08 MED ORDER — ALBUTEROL SULFATE (2.5 MG/3ML) 0.083% IN NEBU: 2.5000 mg | INHALATION_SOLUTION | Freq: Four times a day (QID) | RESPIRATORY_TRACT | Status: DC | PRN

## 2021-06-08 MED ORDER — TRAZODONE HCL 50 MG PO TABS
50.0000 mg | ORAL_TABLET | Freq: Every day | ORAL | Status: DC
  Administered 2021-06-09 (×2): 50 mg via ORAL
  Filled 2021-06-08 (×2): qty 1

## 2021-06-08 MED ORDER — SENNOSIDES-DOCUSATE SODIUM 8.6-50 MG PO TABS: 1.0000 | ORAL_TABLET | Freq: Every evening | ORAL | Status: DC | PRN

## 2021-06-08 MED ORDER — STROKE: EARLY STAGES OF RECOVERY BOOK
Freq: Once | Status: AC
  Administered 2021-06-09: 1
  Filled 2021-06-08: qty 1

## 2021-06-08 MED ORDER — DILTIAZEM HCL ER COATED BEADS 120 MG PO CP24
120.0000 mg | ORAL_CAPSULE | Freq: Every day | ORAL | Status: DC
  Administered 2021-06-09 – 2021-06-10 (×2): 120 mg via ORAL
  Filled 2021-06-08 (×2): qty 1

## 2021-06-08 NOTE — ED Triage Notes (Signed)
Pt here via [facility] for L sided numbness.  Acute onset at 1945.  L arm drift upon arrival.

## 2021-06-08 NOTE — Progress Notes (Signed)
PHARMACIST CODE STROKE RESPONSE  Notified to mix TNK at 2135 by Dr. Otelia Limes Delivered TNK to RN at 2138  TNK dose = 14 mg IV over 5 seconds  Issues/delays encountered (if applicable): Patient delay in providing consent - required some time to think over  Cathie Hoops 06/08/21 9:41 PM

## 2021-06-08 NOTE — ED Provider Notes (Signed)
[facility] EMERGENCY DEPARTMENT Provider Note   CSN: [id number] Arrival date & time: 06/08/21  2109     History Chief Complaint  Patient presents with   Code Stroke    [name] is a 60 y.o. female presenting to emergency department with left-sided weakness.  Reports onset of symptoms around 530 while watching jeopardy.  She says her left leg initially felt weak and numb, and then the spread up to the left arm and the left side of her face.  She denies headache.  She denies chest pain.  She arrived as a code stroke was seen by neurology.  She was taken to CT and had imaging done, then was given TNKase.  She now reports continued heaviness in her left arm and leg, denies headache or blurred vision.  She denies any active chest pain.  She does have a history of MI.  She was on Plavix prior to this  HPI     Past Medical History:  Diagnosis Date   Anxiety    Chest pain 01/2015   CHF (congestive heart failure) (HCC)    COPD (chronic obstructive pulmonary disease) (HCC)    Depression    GERD (gastroesophageal reflux disease)    Hyperlipemia    MI (mitral incompetence)    Stroke [facility])     Patient Active Problem List   Diagnosis Date Noted   Calcification of abdominal aorta (HCC) 02/12/2017   Dyslipidemia 07/11/2016   Acute cystitis without hematuria 01/23/2016   Age-related osteoporosis without current pathological fracture 12/29/2015   Benign hypertension 12/06/2015   Coronary artery disease involving native coronary artery of native heart with angina pectoris (HCC) 12/06/2015   Memory loss 12/30/2013   Stenosis of carotid artery 12/30/2012   Cerebral artery occlusion with cerebral infarction (HCC) 12/30/2012    Past Surgical History:  Procedure Laterality Date   CORONARY ANGIOPLASTY WITH STENT PLACEMENT     TOTAL VAGINAL HYSTERECTOMY       OB History   No obstetric history on file.     Family History  Problem Relation Age of Onset   Cancer  Mother    Stroke Father     Social History   Tobacco Use   Smoking status: Former   Smokeless tobacco: Never  Substance Use Topics   Alcohol use: No   Drug use: No    Home Medications Prior to Admission medications   Medication Sig Start Date End Date Taking? Authorizing Provider  albuterol (PROVENTIL HFA;VENTOLIN HFA) 108 (90 BASE) MCG/ACT inhaler Inhale 2 puffs into the lungs every 6 (six) hours as needed for wheezing.    [provider]  Albuterol Sulfate (PROAIR RESPICLICK) 108 (90 Base) MCG/ACT AEPB INHALE 2 PUFFS 4 TIMES DAILY AS NEEDED FOR SHORTNESS OF BREATH. 07/10/16   [provider]  aspirin (ASPIR-81) 81 MG EC tablet Take 81 mg by mouth daily. Swallow whole.    [provider]  betamethasone dipropionate (DIPROLENE) 0.05 % ointment  06/01/16   [provider]  Calcium Carb-Cholecalciferol (CALCIUM 600 + D PO) Take by mouth.    [provider]  Cetirizine HCl 10 MG CAPS Take 10 mg by mouth daily.    [provider]  clonazePAM (KLONOPIN) 0.5 MG tablet Take 0.5 mg by mouth 2 (two) times daily as needed for anxiety.    [provider]  clopidogrel (PLAVIX) 75 MG tablet Take 1 tablet (75 mg total) by mouth daily. 08/29/17   Anson Fret,  MD  diltiazem (CARDIZEM) 120 MG tablet Take 120 mg by mouth daily.    [provider]  esomeprazole (NEXIUM) 40 MG capsule Take 40 mg by mouth daily at 12 noon.  07/10/16   [provider]  Evolocumab 140 MG/ML SOAJ Inject 140 mg into the skin.  05/30/17   [provider]  ezetimibe (ZETIA) 10 MG tablet Take 10 mg daily by mouth.    [provider]  furosemide (LASIX) 40 MG tablet Take 40 mg by mouth 2 (two) times daily. One tab po every am and 1/2 tab in the pm    [provider]  METOPROLOL TARTRATE PO Take 50 mg by mouth 2 (two) times daily.     [provider]  montelukast (SINGULAIR) 10 MG tablet Take 10 mg by mouth at  bedtime.    [provider]  nitroGLYCERIN (NITROSTAT) 0.4 MG SL tablet Place 0.4 mg under the tongue every 5 (five) minutes as needed for chest pain.    [provider]  potassium chloride SA (K-DUR,KLOR-CON) 20 MEQ tablet Take 20 mEq by mouth 2 (two) times daily.  12/23/13   [provider]  PRESCRIPTION MEDICATION Oxygen 2 liters at night.    [provider]  PROLIA 60 MG/ML SOLN injection Inject 60 mg into the skin every 6 (six) months.  06/18/16   [provider]  ranolazine (RANEXA) 500 MG 12 hr tablet Take 500 mg by mouth 2 (two) times daily.    [provider]  rosuvastatin (CRESTOR) 20 MG tablet Take 20 mg by mouth daily.    [provider]  traZODone (DESYREL) 50 MG tablet Take 50 mg by mouth at bedtime.    [provider]  TRINTELLIX 10 MG TABS Once daily 07/10/16   [provider]  valACYclovir (VALTREX) 1000 MG tablet Take 1,000 mg by mouth. 06/17/17   [provider]  VITAMIN D, CHOLECALCIFEROL, PO Take 25,000 Units by mouth.    [provider]    Allergies    Metronidazole, Scopolamine, Zithromax [azithromycin], Ace inhibitors, Scopolamine hbr, Clindamycin, and Erythromycin base  Review of Systems   Review of Systems  Constitutional:  Negative for chills and fever.  Respiratory:  Negative for cough and shortness of breath.   Cardiovascular:  Negative for chest pain and palpitations.  Gastrointestinal:  Negative for abdominal pain and vomiting.  Genitourinary:  Negative for dysuria and hematuria.  Musculoskeletal:  Negative for arthralgias and myalgias.  Skin:  Negative for color change and rash.  Neurological:  Positive for weakness and numbness. Negative for headaches.  All other systems reviewed and are negative.  Physical Exam Updated Vital Signs BP (!) 140/93   Pulse 89   Temp 98.7 F (37.1 C) (Oral)   Resp 18   Ht 5\' 3"  (1.6 m)   Wt 55.8 kg   SpO2 97%   BMI 21.79  kg/m   Physical Exam Constitutional:      General: She is not in acute distress. HENT:     Head: Normocephalic and atraumatic.  Eyes:     Conjunctiva/sclera: Conjunctivae normal.     Pupils: Pupils are equal, round, and reactive to light.  Cardiovascular:     Rate and Rhythm: Normal rate and regular rhythm.  Pulmonary:     Effort: Pulmonary effort is normal. No respiratory distress.  Abdominal:     General: There is no distension.     Tenderness: There is no abdominal tenderness.  Skin:  General: Skin is warm and dry.  Neurological:     Mental Status: She is alert and oriented to person, place, and time.     Comments: Left arm and left leg 4/5 strength Left facial paresthesia No visible facial droop  Psychiatric:        Mood and Affect: Mood normal.        Behavior: Behavior normal.    ED Results / Procedures / Treatments   Labs (all labs ordered are listed, but only abnormal results are displayed) Labs Reviewed  BASIC METABOLIC PANEL - Abnormal; Notable for the following components:      Result Value   Glucose, Bld 123 (*)    Creatinine, Ser 1.66 (*)    GFR, Estimated 35 (*)    All other components within normal limits  I-STAT CHEM 8, ED - Abnormal; Notable for the following components:   Creatinine, Ser 1.80 (*)    Glucose, Bld 120 (*)    Calcium, Ion 1.14 (*)    All other components within normal limits  CBG MONITORING, ED - Abnormal; Notable for the following components:   Glucose-Capillary 126 (*)    All other components within normal limits  RESP PANEL BY RT-PCR (FLU A&B, COVID) ARPGX2  CBC WITH DIFFERENTIAL/PLATELET  PROTIME-INR  APTT  ETHANOL  RAPID URINE DRUG SCREEN, HOSP PERFORMED  URINALYSIS, ROUTINE W REFLEX MICROSCOPIC  I-STAT BETA HCG BLOOD, ED ([facility], [facility], [facility] ONLY)  TROPONIN I (HIGH SENSITIVITY)    EKG None  Radiology No results found.  Procedures .Critical Care Performed by: Terald Sleeper, MD Authorized by: Terald Sleeper, MD    Critical care provider statement:    Critical care time (minutes):  42   Critical care was necessary to treat or prevent imminent or life-threatening deterioration of the following conditions:  CNS failure or compromise   Critical care was time spent personally by me on the following activities:  Discussions with consultants, evaluation of patient's response to treatment, examination of patient, ordering and performing treatments and interventions, ordering and review of laboratory studies, ordering and review of radiographic studies, pulse oximetry, re-evaluation of patient's condition, obtaining history from patient or surrogate and review of old charts Comments:     Code stroke, TNK administered   Medications Ordered in ED Medications  tenecteplase (TNKASE) injection for Stroke 14 mg (has no administration in time range)  iohexol (OMNIPAQUE) 350 MG/ML injection 65 mL (65 mLs Intravenous Contrast Given 06/08/21 2206)    ED Course  I have reviewed the triage vital signs and the nursing notes.  Pertinent labs & imaging results that were available during my care of the patient were reviewed by me and considered in my medical decision making (see chart for details).  Patient presenting as a code stroke, left-sided weakness.  Neurology consulted, saw pt, discussed the risk and benefits of TNKase, which is administered to the patient.  She is now being observed in the ED.  She will require further work-up.  I personally interpreted labs and EKG, which shows a sinus rhythm with no acute ischemic findings and no significant changes from prior EKG.   Final Clinical Impression(s) / ED Diagnoses Final diagnoses:  None    Rx / DC Orders ED Discharge Orders     None        Terald Sleeper, MD 06/09/21 1038

## 2021-06-08 NOTE — Consult Note (Signed)
NEURO HOSPITALIST CONSULT NOTE   Requestig physician: Dr. Langston Masker  Reason for Consult:Acute onset of left sided weakness  History obtained from:   Patient, EMS and Chart     HPI:                                                                                                                                          [name] is an 60 y.o. female with a history of mitral incompetence, CHF, CAD s/p stenting, COPD, depression, HLD and stroke without residual weakness, who presents to the ED via EMS for acute onset of left sided weakness and sensory numbness. The patient initially stated LKN was 1945 at which time her LUE became weak, but then endorsed acute onset of LLE weakness at 5:30 PM. On further detailed interview, LKN and TOSO were revealed to have been 5:40 PM, 10 minutes into watching a TV program that began at 5:30. Left arm drift was noted on arrival to the ED and a Code Stroke was called.   Detailed interview reveals that after initial symptom of left leg feeling weak and numb, she then noted about 2 hours later that the weakness and numbness were spreading up to the left arm and the left side of her face.  She denied headache and CP. No SOB. No right sided weakness. No vision loss. No aphasia or confusion.      Home medications include ASA and Plavix.   Past Medical History:  Diagnosis Date   Anxiety    Chest pain 01/2015   CHF (congestive heart failure) (HCC)    COPD (chronic obstructive pulmonary disease) (HCC)    Depression    GERD (gastroesophageal reflux disease)    Hyperlipemia    MI (mitral incompetence)    Stroke [facility])     Past Surgical History:  Procedure Laterality Date   CORONARY ANGIOPLASTY WITH STENT PLACEMENT     TOTAL VAGINAL HYSTERECTOMY      Family History  Problem Relation Age of Onset   Cancer Mother    Stroke Father            Social History:  reports that she has quit smoking. She has never used smokeless tobacco. She  reports that she does not drink alcohol and does not use drugs.  Allergies  Allergen Reactions   Metronidazole Rash and Hives   Scopolamine    Zithromax [Azithromycin] Rash   Ace Inhibitors Other (See Comments)    Caused Bp to bottom out hypotension    Scopolamine Hbr Other (See Comments)   Clindamycin Rash    Uncertain   Erythromycin Base Rash    MEDICATIONS:  No current facility-administered medications on file prior to encounter.   Current Outpatient Medications on File Prior to Encounter  Medication Sig Dispense Refill   albuterol (PROVENTIL HFA;VENTOLIN HFA) 108 (90 BASE) MCG/ACT inhaler Inhale 2 puffs into the lungs every 6 (six) hours as needed for wheezing.     Albuterol Sulfate (PROAIR RESPICLICK) 811 (90 Base) MCG/ACT AEPB INHALE 2 PUFFS 4 TIMES DAILY AS NEEDED FOR SHORTNESS OF BREATH.     aspirin (ASPIR-81) 81 MG EC tablet Take 81 mg by mouth daily. Swallow whole.     betamethasone dipropionate (DIPROLENE) 0.05 % ointment      Calcium Carb-Cholecalciferol (CALCIUM 600 + D PO) Take by mouth.     Cetirizine HCl 10 MG CAPS Take 10 mg by mouth daily.     clonazePAM (KLONOPIN) 0.5 MG tablet Take 0.5 mg by mouth 2 (two) times daily as needed for anxiety.     clopidogrel (PLAVIX) 75 MG tablet Take 1 tablet (75 mg total) by mouth daily. 90 tablet 3   diltiazem (CARDIZEM) 120 MG tablet Take 120 mg by mouth daily.     esomeprazole (NEXIUM) 40 MG capsule Take 40 mg by mouth daily at 12 noon.      Evolocumab 140 MG/ML SOAJ Inject 140 mg into the skin.      ezetimibe (ZETIA) 10 MG tablet Take 10 mg daily by mouth.     furosemide (LASIX) 40 MG tablet Take 40 mg by mouth 2 (two) times daily. One tab po every am and 1/2 tab in the pm     METOPROLOL TARTRATE PO Take 50 mg by mouth 2 (two) times daily.      montelukast (SINGULAIR) 10 MG tablet Take 10 mg by mouth at  bedtime.     nitroGLYCERIN (NITROSTAT) 0.4 MG SL tablet Place 0.4 mg under the tongue every 5 (five) minutes as needed for chest pain.     potassium chloride SA (K-DUR,KLOR-CON) 20 MEQ tablet Take 20 mEq by mouth 2 (two) times daily.      PRESCRIPTION MEDICATION Oxygen 2 liters at night.     PROLIA 60 MG/ML SOLN injection Inject 60 mg into the skin every 6 (six) months.      ranolazine (RANEXA) 500 MG 12 hr tablet Take 500 mg by mouth 2 (two) times daily.     rosuvastatin (CRESTOR) 20 MG tablet Take 20 mg by mouth daily.     traZODone (DESYREL) 50 MG tablet Take 50 mg by mouth at bedtime.     TRINTELLIX 10 MG TABS Once daily     valACYclovir (VALTREX) 1000 MG tablet Take 1,000 mg by mouth.     VITAMIN D, CHOLECALCIFEROL, PO Take 25,000 Units by mouth.       ROS:  As per HPI. Comprehensive ROS otherwise negative.    Blood pressure (!) 141/82, pulse 97, temperature 98.7 F (37.1 C), temperature source Oral, resp. rate 16, weight 55.8 kg, SpO2 95 %.   General Examination:                                                                                                       Physical Exam  HEENT-  [place]/AT   Lungs- Respirations unlabored Extremities- No edema  Neurological Examination Mental Status: Awake and alert. Speech fluent with intact comprehension, naming and repetition. Able to follow all commands without difficulty. Cranial Nerves: II: Visual fields intact with no extinction to DSS. PERRL.   III,IV, VI: No ptosis. EOMI. No nystagmus.  V,VII: Smile symmetric, facial temp sensation decreased on the left VIII: Hearing intact to voice IX,X: No hypophonia or hoarseness XI: Head is midline XII: Midline tongue extension Motor: RUE and RLE 5/5 LUE 4/5 proximally and distally with combined lateral and vertical drift when testing [place] LLE 4/5  proximally and distally  Sensory: FT sensation equal BUE and BLE, but with extinction on the left with DSS. Temp sensation reduced to LUE and LLE Deep Tendon Reflexes: 2+ bilateral patellae Plantars: Right: downgoing   Left: downgoing Cerebellar: Mild ataxia with left FNF and left H-S Gait: Deferred   Lab Results: Basic Metabolic Panel: Recent Labs  Lab 06/08/21 2114 06/08/21 2123  NA 138 139  K 4.0 4.0  CL 101 100  CO2 27  --   GLUCOSE 123* 120*  BUN 16 18  CREATININE 1.66* 1.80*  CALCIUM 9.6  --     CBC: Recent Labs  Lab 06/08/21 2114 06/08/21 2123  WBC 8.5  --   NEUTROABS 5.2  --   HGB 14.2 14.3  HCT 41.4 42.0  MCV 94.5  --   PLT 367  --     Cardiac Enzymes: No results for input(s): CKTOTAL, CKMB, CKMBINDEX, TROPONINI in the last 168 hours.  Lipid Panel: No results for input(s): CHOL, TRIG, HDL, CHOLHDL, VLDL, LDLCALC in the last 168 hours.  Imaging: No results found.   Assessment: 60 year old female presenting with acute onset of left sided weakness and sensory numbness 1. Neurological exam findings best localize as a right thalamic lesion, most likely an acute lacunar infarction.  2. CT head: Question 9 mm hypodensity involving the right thalamus, age indeterminate, but could reflect a small acute ischemic infarct. No intracranial hemorrhage. ASPECTS is 10.  3. Benefits of CTA for management of acute stroke are felt to outweigh risks regarding her renal function in the setting of Cr of 1.8 and eGFR of 35. Although thalamic infarction is most likely, other potential localizations are possible and distal basilar artery thrombosis is also on the DDx. 4. CTA of head and neck: Negative CTA for large vessel occlusion. Mild for age atheromatous change about the carotid bifurcations and carotid siphons without hemodynamically significant stenosis.  5. Stroke risk factors: CHF, CAD, HLD and stroke 6. After comprehensive review of possible contraindications, she has no  absolute contraindications to  TNK administration. 7. Patient is a TNK candidate. Discussed extensively the risks/benefits of TNK treatment vs. no treatment with the patient, including risks of hemorrhage and death with IV thrombolytic administration versus worse overall outcomes on average in patients within the thrombolysis time window who are not administered TNK. Overall benefits of TNK regarding long-term prognosis are felt to outweigh risks. The patient expressed understanding and wish to proceed with TNK.   Recommendations: 1. Admitting to Neuro ICU.  2. Post-tPA order set to include frequent neuro checks and BP management.  3. No antiplatelet medications or anticoagulants for at least 24 hours following tPA.  4. DVT prophylaxis with SCDs.  5. Will need to be started on a statin.  6. Will need to restart DAPT if follow up CT at 24 hours is negative for hemorrhagic conversion. 7. HgbA1c, fasting lipid panel 8. TTE.  9. MRI brain  10. PT/OT/Speech.  11. NPO until passes swallow evaluation.  12. Risk factor modification 13. Cardiac telemetry 14. Nephrology consult and IVF following contrast administration for emergent CTA. Labs consistent with AKI but with no known history of CKD.  60 minutes spent in the emergent neurological evaluation and management of this critically ill patient  Electronically signed: Dr. Kerney Elbe 06/08/2021, 9:52 PM

## 2021-06-08 NOTE — ED Provider Notes (Signed)
Emergency Medicine Provider Triage Evaluation Note  [name] , a 60 y.o. female  was evaluated in triage.  Patient presents today with EMS for concerns of left-sided numbness and tingling.  Last seen normal 1945.  Patient does have a history of CVA without residual weakness.  Patient reports her entire left side is numb and weak.   Review of Systems  Positive: Weakness, paresthesias Negative: Visual change, lightheadedness, shortness of breath  Physical Exam  There were no vitals taken for this visit. Gen:   Awake, no distress   Resp:  Normal effort  MSK:   Left lower extremity 4/5.  Right lower extremity 5/5.  Grip strength 5/5 and symmetrical.  Bilateral upper extremity 5/5.  Does exhibit some pronator drift in left upper extremity.  Cranial nerves 2-12 intact.  Without facial droop. Other:    Medical Decision Making  Medically screening exam initiated at 9:09 PM.  Appropriate orders placed.  [name] was informed that the remainder of the evaluation will be completed by another provider, this initial triage assessment does not replace that evaluation, and the importance of remaining in the ED until their evaluation is complete.  Strokelike symptoms   Marita [place], Cordelia Poche 06/08/21 2114    Glendora Score, MD 06/08/21 2316

## 2021-06-08 NOTE — ED Notes (Signed)
Unable to provide urine specimen. Purewick remains in place.

## 2021-06-09 ENCOUNTER — Inpatient Hospital Stay (HOSPITAL_COMMUNITY): Payer: Medicare Other

## 2021-06-09 DIAGNOSIS — I6300 Cerebral infarction due to thrombosis of unspecified precerebral artery: Secondary | ICD-10-CM | POA: Diagnosis not present

## 2021-06-09 DIAGNOSIS — I6389 Other cerebral infarction: Secondary | ICD-10-CM

## 2021-06-09 LAB — BASIC METABOLIC PANEL
Anion gap: 6 (ref 5–15)
BUN: 14 mg/dL (ref 6–20)
CO2: 27 mmol/L (ref 22–32)
Calcium: 8.2 mg/dL — ABNORMAL LOW (ref 8.9–10.3)
Chloride: 105 mmol/L (ref 98–111)
Creatinine, Ser: 1.4 mg/dL — ABNORMAL HIGH (ref 0.44–1.00)
GFR, Estimated: 43 mL/min — ABNORMAL LOW (ref >60)
Glucose, Bld: 110 mg/dL — ABNORMAL HIGH (ref 70–99)
Potassium: 3.1 mmol/L — ABNORMAL LOW (ref 3.5–5.1)
Sodium: 138 mmol/L (ref 135–145)

## 2021-06-09 LAB — MRSA NEXT GEN BY PCR, NASAL: MRSA by PCR Next Gen: NOT DETECTED

## 2021-06-09 LAB — LIPID PANEL
Cholesterol: 94 mg/dL (ref 0–200)
HDL: 49 mg/dL (ref >40)
LDL Cholesterol: 32 mg/dL (ref 0–99)
Total CHOL/HDL Ratio: 1.9 RATIO
Triglycerides: 67 mg/dL (ref <150)
VLDL: 13 mg/dL (ref 0–40)

## 2021-06-09 LAB — RAPID URINE DRUG SCREEN, HOSP PERFORMED
Amphetamines: NOT DETECTED
Barbiturates: NOT DETECTED
Benzodiazepines: NOT DETECTED
Cocaine: NOT DETECTED
Opiates: NOT DETECTED
Tetrahydrocannabinol: NOT DETECTED

## 2021-06-09 LAB — URINALYSIS, ROUTINE W REFLEX MICROSCOPIC
Bilirubin Urine: NEGATIVE
Glucose, UA: NEGATIVE mg/dL
Hgb urine dipstick: NEGATIVE
Ketones, ur: NEGATIVE mg/dL
Leukocytes,Ua: NEGATIVE
Nitrite: NEGATIVE
Protein, ur: NEGATIVE mg/dL
Specific Gravity, Urine: 1.009 (ref 1.005–1.030)
pH: 7 (ref 5.0–8.0)

## 2021-06-09 LAB — ECHOCARDIOGRAM COMPLETE
Area-P 1/2: 6.17 cm2
Calc EF: 47 %
Height: 63 in
S' Lateral: 3 cm
Single Plane A2C EF: 47.1 %
Single Plane A4C EF: 45.5 %
Weight: 1968 oz

## 2021-06-09 LAB — HIV ANTIBODY (ROUTINE TESTING W REFLEX): HIV Screen 4th Generation wRfx: NONREACTIVE

## 2021-06-09 LAB — HEMOGLOBIN A1C
Hgb A1c MFr Bld: 5.6 % (ref 4.8–5.6)
Mean Plasma Glucose: 114.02 mg/dL

## 2021-06-09 LAB — RESP PANEL BY RT-PCR (FLU A&B, COVID) ARPGX2
Influenza A by PCR: NEGATIVE
Influenza B by PCR: NEGATIVE
SARS Coronavirus 2 by RT PCR: NEGATIVE

## 2021-06-09 LAB — TROPONIN I (HIGH SENSITIVITY): Troponin I (High Sensitivity): 9 ng/L (ref <18)

## 2021-06-09 MED ORDER — INFLUENZA VAC SPLIT QUAD 0.5 ML IM SUSY
0.5000 mL | PREFILLED_SYRINGE | INTRAMUSCULAR | Status: AC
  Administered 2021-06-10: 0.5 mL via INTRAMUSCULAR
  Filled 2021-06-09: qty 0.5

## 2021-06-09 MED ORDER — CHLORHEXIDINE GLUCONATE CLOTH 2 % EX PADS
6.0000 | MEDICATED_PAD | Freq: Every day | CUTANEOUS | Status: DC
  Administered 2021-06-09 – 2021-06-10 (×2): 6 via TOPICAL

## 2021-06-09 MED ORDER — PANTOPRAZOLE SODIUM 40 MG PO TBEC: 40.0000 mg | DELAYED_RELEASE_TABLET | Freq: Every day | ORAL | Status: DC

## 2021-06-09 NOTE — Progress Notes (Signed)
STROKE TEAM PROGRESS NOTE   INTERVAL HISTORY No acute events Today she is feeling much better with improvement in her presenting symptoms of left sided weakness and numbness. She recalls taking her plavix and aspirin as prescribed since her last stroke.  Her sister is at the bedside.  We discussed her stroke diagnosis, ongoing work up and plan of care. We discussed need to switch medication regimen to brilinta instead of plavix for 30 days and long term as well if affordable. Questions were answered. Of note her daughter died due to stroke earlier this year.  Vitals:   06/09/21 0530 06/09/21 0600 06/09/21 0744 06/09/21 0800  BP: (!) 121/58 (!) 140/103 90/66   Pulse: 87 79 64   Resp: 16 (!) 21 13   Temp:    97.6 F (36.4 C)  TempSrc:    Oral  SpO2: 92% 94% 91%   Weight:      Height:       CBC:  Recent Labs  Lab 06/08/21 2114 06/08/21 2123  WBC 8.5  --   NEUTROABS 5.2  --   HGB 14.2 14.3  HCT 41.4 42.0  MCV 94.5  --   PLT 367  --    Basic Metabolic Panel:  Recent Labs  Lab 06/08/21 2114 06/08/21 2123 06/09/21 0311  NA 138 139 138  K 4.0 4.0 3.1*  CL 101 100 105  CO2 27  --  27  GLUCOSE 123* 120* 110*  BUN 16 18 14   CREATININE 1.66* 1.80* 1.40*  CALCIUM 9.6  --  8.2*   Lipid Panel:  Recent Labs  Lab 06/09/21 0311  CHOL 94  TRIG 67  HDL 49  CHOLHDL 1.9  VLDL 13  LDLCALC 32   HgbA1c:  Recent Labs  Lab 06/09/21 0311  HGBA1C 5.6   Urine Drug Screen: No results for input(s): LABOPIA, COCAINSCRNUR, LABBENZ, AMPHETMU, THCU, LABBARB in the last 168 hours.  Alcohol Level  Recent Labs  Lab 06/08/21 2114  ETH <10    IMAGING past 24 hours MR BRAIN WO CONTRAST  Result Date: 06/09/2021 CLINICAL DATA:  Follow-up examination for acute stroke. EXAM: MRI HEAD WITHOUT CONTRAST TECHNIQUE: Multiplanar, multiecho pulse sequences of the brain and surrounding structures were obtained without intravenous contrast. COMPARISON:  CTs from 06/08/2021. FINDINGS: Brain:  Cerebral volume within normal limits for age. Scattered patchy T2/FLAIR hyperintensity present within the periventricular deep white matter both cerebral hemispheres as well as the pons, most consistent with chronic small vessel ischemic disease, mild in nature. No abnormal foci of restricted diffusion to suggest acute or subacute ischemia. Gray-white matter differentiation maintained. No encephalomalacia to suggest chronic cortical infarction. No evidence for acute or chronic intracranial hemorrhage. No mass lesion, midline shift or mass effect. No hydrocephalus or extra-axial fluid collection. Pituitary gland suprasellar region normal. Midline structures intact. Vascular: Major intracranial vascular flow voids are maintained. Skull and upper cervical spine: Craniocervical junction within normal limits. Bone marrow signal intensity normal. No scalp soft tissue abnormality. Sinuses/Orbits: Prior bilateral ocular lens replacement. Paranasal sinuses are largely clear. No mastoid effusion. Inner ear structures grossly normal. Other: None. IMPRESSION: 1. No acute intracranial abnormality. 2. Mild chronic microvascular ischemic disease for age. Electronically Signed   By: 08/08/2021 M.D.   On: 06/09/2021 01:24   CT HEAD CODE STROKE WO CONTRAST  Result Date: 06/08/2021 CLINICAL DATA:  Code stroke. Initial evaluation for neuro deficit, stroke suspected, left lower extremity weakness. EXAM: CT HEAD WITHOUT CONTRAST TECHNIQUE: Contiguous axial images were  obtained from the base of the skull through the vertex without intravenous contrast. COMPARISON:  Prior CT from 09/28/2010. FINDINGS: Brain: Age-related cerebral atrophy. No acute intracranial hemorrhage. There is question of a 9 mm hypodensity involving the right thalamus (series 2, image 16), age indeterminate. Otherwise, gray-white matter differentiation maintained with no visible acute large vessel territory infarct. No mass lesion, midline shift or mass  effect no hydrocephalus or extra-axial fluid collection. Vascular: No hyperdense vessel. Skull: Scalp soft tissues and calvarium within normal limits. Sinuses/Orbits: Globes orbital soft tissues within normal limits. Paranasal sinuses are clear. No mastoid effusion. Other: None. ASPECTS [facility] Stroke Program Early CT Score) - Ganglionic level infarction (caudate, lentiform nuclei, internal capsule, insula, M1-M3 cortex): 7 - Supraganglionic infarction (M4-M6 cortex): 3 Total score (0-10 with 10 being normal): 10 IMPRESSION: 1. Question 9 mm hypodensity involving the right thalamus, age indeterminate, but could reflect a small acute ischemic infarct. No intracranial hemorrhage. 2. ASPECTS is 10. Case discussed by telephone at the time of interpretation on 06/08/2021 at 9:30 pm to provider Dr. Otelia Limes. Electronically Signed   By: Rise Mu M.D.   On: 06/08/2021 21:43   CT ANGIO HEAD NECK W WO CM (CODE STROKE)  Result Date: 06/08/2021 CLINICAL DATA:  Initial evaluation for acute stroke, left-sided weakness. EXAM: CT ANGIOGRAPHY HEAD AND NECK TECHNIQUE: Multidetector CT imaging of the head and neck was performed using the standard protocol during bolus administration of intravenous contrast. Multiplanar CT image reconstructions and MIPs were obtained to evaluate the vascular anatomy. Carotid stenosis measurements (when applicable) are obtained utilizing NASCET criteria, using the distal internal carotid diameter as the denominator. CONTRAST:  1mL OMNIPAQUE IOHEXOL 350 MG/ML SOLN COMPARISON:  Head CT from earlier the same day. FINDINGS: CTA NECK FINDINGS Aortic arch: Visualized aortic arch normal caliber with normal branch pattern. Mild atheromatous change within the arch itself. No stenosis about the origin of the great vessels. Right carotid system: Right CCA patent from its origin to the bifurcation without stenosis. Mild for age atheromatous change about the right carotid bulb without stenosis. Right  ICA patent distally without stenosis, dissection or occlusion. Left carotid system: Left CCA patent from its origin to the bifurcation without stenosis. Mild for age atheromatous change about the left carotid bulb without stenosis. Left ICA patent distally without stenosis, dissection or occlusion. Vertebral arteries: Both vertebral arteries arise from subclavian arteries. No proximal subclavian artery stenosis. Left vertebral artery dominant. Vertebral arteries patent without stenosis, dissection or occlusion. Skeleton: No discrete worrisome osseous lesions. Median sternotomy wires noted. Mild for age cervical spondylosis. Other neck: No other acute soft tissue abnormality within the neck. Few subcentimeter left thyroid nodules measure up to 5 mm, of doubtful significance given size and patient age, no follow-up imaging recommended (ref: J Am Coll Radiol. 2015 Feb;12(2): 143-50). Upper chest: Advanced centrilobular emphysematous changes seen within the visualized lungs. Irregular pleuroparenchymal thickening noted at the peripheral upper lobes, right greater than left. Review of the MIP images confirms the above findings CTA HEAD FINDINGS Anterior circulation: Petrous segments patent bilaterally. Mild for age atheromatous change within the carotid siphons without stenosis. A1 segments widely patent. Normal anterior communicating artery complex. Anterior cerebral arteries patent to their distal aspects without stenosis. Normal in stenosis or occlusion. Normal MCA bifurcations. Distal MCA branches well perfused and symmetric. Posterior circulation: Both V4 segments patent to the vertebrobasilar junction without stenosis. Both PICA origins patent and normal. Basilar widely patent to its distal aspect without stenosis. Superior cerebellar arteries patent  bilaterally. Both PCAs primarily supplied via the basilar and are well perfused to there distal aspects. Venous sinuses: Patent allowing for timing the contrast bolus.  Anatomic variants: None significant. Review of the MIP images confirms the above findings IMPRESSION: 1. Negative CTA for large vessel occlusion. 2. Mild for age atheromatous change about the carotid bifurcations and carotid siphons without hemodynamically significant stenosis. 3. Emphysema (ICD10-J43.9). Results discussed by telephone at the time of interpretation on 06/08/2021 at 10:15 p.m. to provider ERIC LINDZEN. Electronically Signed   By: Rise Mu M.D.   On: 06/08/2021 23:05    PHYSICAL EXAM Pleasant middle-aged lady not in distress . Afebrile. Head is nontraumatic. Neck is supple without bruit.    Cardiac exam no murmur or gallop. Lungs are clear to auscultation. Distal pulses are well felt.  Neurological Exam :  awake alert oriented x 3 normal speech and language.  No face asymmetry. Tongue midline. No drift. Mild diminished fine finger movements on left. Orbits right over left upper extremity. Mild subjective left grip weak.  Mild tremor of outstretched upper extremity on the left arm and leg.  Effort is variable and strength is better when distracted.. Normal sensation . Normal coordination.  ASSESSMENT/PLAN [name] is an 60 y.o. female with a history of mitral incompetence, CHF, CAD s/p stenting, COPD, depression, HLD and stroke without residual weakness, who presents to the ED via EMS for acute onset of left sided weakness and sensory numbness. The patient initially stated LKN was 1945 at which time her LUE became weak, but then endorsed acute onset of LLE weakness at 5:30 PM. On further detailed interview, LKN and TOSO were revealed to have been 5:40 PM, 10 minutes into watching a TV program that began at 5:30. Left arm drift was noted on arrival to the ED and a Code Stroke was called. TNK was given.    TIA involving right thalamic region s/p tNK with good clinical improvement etiology likely small vessel disease  CT head: Question 9 mm hypodensity involving the right  thalamus, age indeterminate, but could reflect a small acute ischemic infarct. No intracranial hemorrhage. ASPECTS is 10.   CTA of head and neck:  Negative CTA for large vessel occlusion. Mild for age atheromatous change about the carotid bifurcations and carotid siphons without hemodynamically significant stenosis.  MRI  No acute intracranial abnormality. Mild chronic microvascular ischemic disease for age. 2D Echo  EF 45-50%,  The left ventricle has mildly decreased function. The left ventricle demonstrates regional wall motion abnormalities  Left ventricular diastolic parameters are consistent with Grade I diastolic dysfunction  (impaired relaxation).   2. Right ventricular systolic function is normal. The right ventricular  size is normal. There is normal pulmonary artery systolic pressure.   3. The mitral valve is normal in structure. No evidence of mitral valve regurgitation. No evidence of mitral stenosis.   4. The aortic valve is normal in structure. Aortic valve regurgitation is not visualized. No aortic stenosis is present.   5. The inferior vena cava is normal in size with greater than 50%  respiratory variability, suggesting right atrial pressure of 3 mmHg.   Conclusion(s)/Recommendation(s): No intracardiac source of embolism detected on this transthoracic study.  LDL 32 HgbA1c 5.6 VTE prophylaxis - SCDs    Diet   Diet regular Room service appropriate? Yes; Fluid consistency: Thin   On ASA and plavix prior to admission in the setting post stroke and cardiac stents  Pending stable follow up imaging recommend ASA  and brilinta x 30 days, if financially feasible may stay on brilinta afterward or may need to return to plavix for financial reasons.  Therapy recommendations:  HHPT/PENDING Disposition:  TBD  Hypertension Home meds:  metoprolol 50mg  BID, cardizem  Stable currently on cardizem, metoprolol to be added back in later  Post thrombolytic bp goal 180/105 Long-term BP  goal normotensive        History of CHF Hold home lasix/potassium as below  On cardizem BB on hold   Hyperlipidemia Home meds: Crestor 20mg , Zetia 10mg   LDL 32, goal < 70 High intensity statin not indicated as patient is at goal Continue statin at discharge      Glucose management No hx DM2 HgbA1c 5.6, goal < 7.0 CBGs Recent Labs    06/08/21 2111  GLUCAP 126*    Stable        AKI Creatinine improving Continue hydration with MIVF Hold home lasix/potassium       Other Stroke Risk Factors Previous Cigarette smoker Hx stroke/TIA Family hx stroke (Daughter, Father) Coronary artery disease, stents x 7 CHF       Other Active Problems COPD: continue home proventil inhaler  GERD: continue PPI Anxiety: continue home clonazepam  Hospital day # 1  This patient was seen and evaluated with Dr. . He directed the plan of care.  08/08/21, NP-C   STROKE MD  NOTE: I have personally obtained history,examined this patient, reviewed notes, independently viewed imaging studies, participated in medical decision making and plan of care.ROS completed by me personally and pertinent positives fully documented  I have made any additions or clarifications directly to the above note. Agree with note above.  Patient presented left-sided weakness and numbness likely due to his small right subcortical infarct not seen on MRI versus TIA treated with IV thrombolysis with excellent recovery.  Continue close neurological monitoring and strict blood pressure control as per postthrombolytic protocol.  Mobilize out of bed.  Therapy consults.  Check urine drug screen.  She was already on aspirin and Plavix hence recommend aspirin and Brilinta for 30 days and then continue further if she can afford it otherwise change back to aspirin and Plavix.  Long discussion with patient and sister and answered questions.This patient is critically ill and at significant risk of neurological worsening, death  and care requires constant monitoring of vital signs, hemodynamics,respiratory and cardiac monitoring, extensive review of multiple databases, frequent neurological assessment, discussion with family, other specialists and medical decision making of high complexity.I have made any additions or clarifications directly to the above note.This critical care time does not reflect procedure time, or teaching time or supervisory time of PA/NP/Med Resident etc but could involve care discussion time.  I spent 30 minutes of neurocritical care time  in the care of  this patient.      2112, MD Medical Director [facility] Stroke Center Pager: [phone] 06/09/2021 4:47 PM   To contact Stroke Continuity provider, please refer to [facility]. After hours, contact General Neurology

## 2021-06-09 NOTE — Progress Notes (Signed)
Received pt from [address], alert and oriented x4, all questions answered, call light in reach, MD orders implemented  06/09/21 2201  Vitals  Temp 98.7 F (37.1 C)  Temp Source Oral  BP 126/82  MAP (mmHg) 96  BP Location Left Arm  BP Method Automatic  Patient Position (if appropriate) Lying  Pulse Rate 69  Pulse Rate Source Dinamap  Resp 20  MEWS COLOR  MEWS Score Color Green  Oxygen Therapy  SpO2 98 %  O2 Device Room Air  Height and Weight  Height 5\' 3"  (1.6 m)  Weight 55.2 kg  BSA (Calculated - sq m) 1.57 sq meters  BMI (Calculated) 21.56  Weight in (lb) to have BMI = 25 140.8  MEWS Score  MEWS Temp 0  MEWS Systolic 0  MEWS Pulse 0  MEWS RR 0  MEWS LOC 0  MEWS Score 0

## 2021-06-09 NOTE — Progress Notes (Signed)
OT Cancellation Note  Patient Details Name: [name] MRN: [id number] DOB: 06/14/1961   Cancelled Treatment:    Reason Eval/Treat Not Completed: Active bedrest order OT order received and appreciated however this conflicts with current bedrest order set. Please increase activity tolerance as appropriate and remove bedrest from orders. . Please contact OT at [phone] if bed rest order is discontinued. OT will hold evaluation at this time and will check back as time allows pending increased activity orders.   Wynona Neat, OTR/L  Acute Rehabilitation Services Pager: [phone] Office: [phone] .  06/09/2021, 8:07 AM

## 2021-06-09 NOTE — ED Notes (Signed)
Patient transported to MRI 

## 2021-06-09 NOTE — Progress Notes (Signed)
Occupational Therapy Treatment Patient Details Name: [name] MRN: [id number] DOB: December 17, 1960 Today's Date: 06/09/2021   History of present illness [name] is an 60 y.o. female with a history of mitral incompetence, CHF, CAD s/p stenting, COPD, depression, HLD and stroke without residual weakness, who presented with L weakness and numbness.  She had TNK and initial MRI showed no acute changes.   OT comments  PT admitted with L side weakness. Pt currently with functional limitiations due to the deficits listed below (see OT problem list). Pt noted to have decreased balance and a tremor (shaking) with purposeful movement. Pt and sister report that this lasted a few weeks after the first stroke. Pt will only have PRN (A) upon d/c with pending moving apartments any time now.  Pt will benefit from skilled OT to increase their independence and safety with adls and balance to allow discharge HHOT.     Recommendations for follow up therapy are one component of a multi-disciplinary discharge planning process, led by the attending physician.  Recommendations may be updated based on patient status, additional functional criteria and insurance authorization.    Follow Up Recommendations  Home health OT    Equipment Recommendations  3 in 1 bedside commode;Other (comment) (RW)    Recommendations for Other Services      Precautions / Restrictions Precautions Precautions: Fall       Mobility Bed Mobility               General bed mobility comments: in chair on arrival    Transfers Overall transfer level: Needs assistance Equipment used: None Transfers: Sit to/from Stand Sit to Stand: Min guard;Min assist         General transfer comment: initial standing cued not to reach for walker, but still hanging onto chair and needing A to prevent posterior LOB    Balance Overall balance assessment: Needs assistance   Sitting balance-Leahy Scale: Good     Standing balance  support: Single extremity supported Standing balance-Leahy Scale: Poor Standing balance comment: UE support or minA for balance with slight posterior bias                           ADL either performed or assessed with clinical judgement   ADL Overall ADL's : Needs assistance/impaired Eating/Feeding: Modified independent;Sitting   Grooming: Oral care;Wash/dry face;Min guard;Sitting Grooming Details (indicate cue type and reason): pt required sitting for grooming task due to unsteady with static standing Upper Body Bathing: Set up;Sitting   Lower Body Bathing: Set up;Sitting/lateral leans Lower Body Bathing Details (indicate cue type and reason): can figure 4 cross Upper Body Dressing : Set up;Sitting   Lower Body Dressing: Set up;Sit to/from stand   Toilet Transfer: Minimal assistance;RW;Ambulation;BSC           Functional mobility during ADLs: Minimal assistance;Rolling walker General ADL Comments: pt expressed alot of stress over last few weeks. pt expressed daughter passing due to stroke and pending court for grandson guardarianship. pt expressed knowing that she can't get her grandson now after this happened to her. Requesting pastoral care come talk to patient and pt agreed it be helpful. pt expressed participating in hospice support group and it helping with process.     Vision Baseline Vision/History: 1 Wears glasses     Perception     Praxis      Cognition Arousal/Alertness: Awake/alert Behavior During Therapy: WFL for tasks assessed/performed Overall Cognitive Status: Within  Functional Limits for tasks assessed                                          Exercises     Shoulder Instructions       General Comments sister in room and supportive. Sister works for a business that requires [employer] locked up during working hours and 12 hour shifts.    Pertinent Vitals/ Pain       Pain Assessment: No/denies pain  Home Living  Family/patient expects to be discharged to:: Private residence   Available Help at Discharge: Available PRN/intermittently (sister works, has neighbors that are around and help if needed) Type of Home: Apartment Home Access: Stairs to enter [employer] of Steps: 5 Entrance Stairs-Rails: Left;Right Home Layout: One level     Bathroom Shower/Tub: Tub/shower unit;Door   [employer] Toilet: Standard     Home Equipment: Environmental consultant - 2 wheels;Cane - single point   Additional Comments: pending moving into a handicap apartment but awaiting approval from [employer] housing to clear the new place due to a back ordered counter top      Prior Functioning/Environment Level of Independence: Independent  Gait / Transfers Assistance Needed: walks without DME ADL's / Homemaking Assistance Needed: 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, [employer] delivers food, can drive   Comments: no animals   Frequency  Min 2X/week        Progress Toward Goals  OT Goals(current goals can now be found in the care plan section)     Acute Rehab OT Goals Patient Stated Goal: return to independent OT Goal Formulation: With patient Time For Goal Achievement: 06/23/21 Potential to Achieve Goals: Good  Plan      Co-evaluation                 AM-PAC OT "6 Clicks" Daily Activity     Outcome Measure   Help from another person eating meals?: None Help from another person taking care of personal grooming?: None Help from another person toileting, which includes using toliet, bedpan, or urinal?: None Help from another person bathing (including washing, rinsing, drying)?: None Help from another person to put on and taking off regular upper body clothing?: None Help from another person to put on and taking off regular lower body clothing?: None 6 Click Score: 24    End of Session Equipment Utilized During Treatment: Rolling walker;Gait belt  OT Visit Diagnosis: Unsteadiness on  feet (R26.81);Muscle weakness (generalized) (M62.81)   Activity Tolerance Patient tolerated treatment well   Patient Left in chair;with call bell/phone within reach;with chair alarm set;with family/visitor present   Nurse Communication Mobility status;Precautions        Time:  -     Charges:     Timmothy Euler, OTR/L  Acute Rehabilitation Services Pager: [phone] Office: [phone] .   Mateo Flow 06/09/2021, 3:13 PM

## 2021-06-09 NOTE — Progress Notes (Signed)
  Echocardiogram 2D Echocardiogram has been performed.  Delcie Roch 06/09/2021, 9:53 AM

## 2021-06-09 NOTE — Progress Notes (Signed)
PT Cancellation Note  Patient Details Name: [name] MRN: [id number] DOB: 10-04-1960   Cancelled Treatment:    Reason Eval/Treat Not Completed: Active bedrest order; s/p TNK last evening.  Will attempt again another day.   Elray Mcgregor 06/09/2021, 9:17 AM Sheran Lawless, PT Acute Rehabilitation Services [phone] Office:[phone] 06/09/2021

## 2021-06-09 NOTE — Evaluation (Signed)
Physical Therapy Evaluation Patient Details Name: [name] MRN: [id number] DOB: 1961/03/05 Today's Date: 06/09/2021  History of Present Illness  [name] is an 60 y.o. female with a history of mitral incompetence, CHF, CAD s/p stenting, COPD, depression, HLD and stroke without residual weakness, who presented with L weakness and numbness.  She had TNK and initial MRI showed no acute changes.  Clinical Impression  Patient presents with decreased mobility due to weakness on L side and some balance issues.  She was able to ambulate with min A with RW.  Feel she should progress and be able to go home with intermittent help from sister and neighbor.  Feel she will benefit from HHPT.  PT to follow and practice steps and work on balance prior to d/c.        Recommendations for follow up therapy are one component of a multi-disciplinary discharge planning process, led by the attending physician.  Recommendations may be updated based on patient status, additional functional criteria and insurance authorization.  Follow Up Recommendations Home health PT;Supervision - Intermittent    Equipment Recommendations  None recommended by PT    Recommendations for Other Services       Precautions / Restrictions Precautions Precautions: Fall      Mobility  Bed Mobility               General bed mobility comments: in chair on arrival    Transfers Overall transfer level: Needs assistance Equipment used: None Transfers: Sit to/from Stand Sit to Stand: Min guard;Min assist         General transfer comment: initial standing cued not to reach for walker, but still hanging onto chair and needing A to prevent posterior LOB  Ambulation/Gait Ambulation/Gait assistance: Min guard;Min assist Gait Distance (Feet): 150 Feet Assistive device: Rolling walker (2 wheeled) Gait Pattern/deviations: Step-through pattern;Decreased stride length     General Gait Details: good rollover and heel  strike bilaterally with ambulation, assist for balance/cues for walker proximity  Stairs            Wheelchair Mobility    Modified Rankin (Stroke Patients Only) Modified Rankin (Stroke Patients Only) Pre-Morbid Rankin Score: No significant disability Modified Rankin: Moderately severe disability     Balance Overall balance assessment: Needs assistance   Sitting balance-Leahy Scale: Good     Standing balance support: Single extremity supported Standing balance-Leahy Scale: Poor Standing balance comment: UE support or minA for balance with slight posterior bias                             Pertinent Vitals/Pain Pain Assessment: No/denies pain    Home Living Family/patient expects to be discharged to:: Private residence Living Arrangements: Alone Available Help at Discharge: Available PRN/intermittently (sister works, has neighbors that are around and help if needed) Type of Home: Apartment Home Access: Stairs to enter Entrance Stairs-Rails: Lawyer of Steps: 5 Home Layout: One level Home Equipment: Environmental consultant - 2 wheels;Cane - single point Additional Comments: pending moving into a handicap apartment but awaiting approval from [employer] housing to clear the new place due to a back ordered counter top    Prior Function Level of Independence: Independent   Gait / Transfers Assistance Needed: walks without DME  ADL's / Homemaking Assistance Needed: 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, [employer] delivers food, can drive  Comments: no animals     Hand Dominance  Dominant Hand: Right    Extremity/Trunk Assessment   Upper Extremity Assessment Upper Extremity Assessment: Defer to OT evaluation    Lower Extremity Assessment Lower Extremity Assessment: Generalized weakness;LLE deficits/detail LLE Deficits / Details: AROM WFL, strength hip flexion 3+/5, knee extension 4-/5, ankle DF 3+/5 LLE Coordination:  decreased gross motor    Cervical / Trunk Assessment Cervical / Trunk Assessment: Kyphotic  Communication   Communication: No difficulties  Cognition Arousal/Alertness: Awake/alert Behavior During Therapy: WFL for tasks assessed/performed Overall Cognitive Status: Within Functional Limits for tasks assessed                                        General Comments General comments (skin integrity, edema, etc.): sister in the room and supporting, but works long shifts at [employer].  Patient's daughter recently passed away from recurrent stroke (59 y/o)    Exercises     Assessment/Plan    PT Assessment Patient needs continued PT services  PT Problem List Decreased strength;Decreased balance;Decreased activity tolerance;Decreased safety awareness;Decreased coordination;Decreased knowledge of use of DME       PT Treatment Interventions DME instruction;Therapeutic activities;Gait training;Therapeutic exercise;Patient/family education;Balance training;Stair training;Functional mobility training    PT Goals (Current goals can be found in the Care Plan section)  Acute Rehab PT Goals Patient Stated Goal: return to independent PT Goal Formulation: With patient/family Time For Goal Achievement: 06/23/21 Potential to Achieve Goals: Good    Frequency Min 4X/week   Barriers to discharge        Co-evaluation               AM-PAC PT "6 Clicks" Mobility  Outcome Measure Help needed turning from your back to your side while in a flat bed without using bedrails?: None Help needed moving from lying on your back to sitting on the side of a flat bed without using bedrails?: A Little Help needed moving to and from a bed to a chair (including a wheelchair)?: A Little Help needed standing up from a chair using your arms (e.g., wheelchair or bedside chair)?: A Little Help needed to walk in hospital room?: A Little Help needed climbing 3-5 steps with a railing? : A  Little 6 Click Score: 19    End of Session   Activity Tolerance: Patient tolerated treatment well Patient left: in chair;with call bell/phone within reach;with family/visitor present   PT Visit Diagnosis: Other abnormalities of gait and mobility (R26.89);Muscle weakness (generalized) (M62.81);Other symptoms and signs involving the nervous system (R29.898)    Time: 9892-1194 PT Time Calculation (min) (ACUTE ONLY): 24 min   Charges:   PT Evaluation $PT Eval Moderate Complexity: 1 Mod          Cyndi Clotilde Loth, PT Acute Rehabilitation Services Pager:[phone] [phone] 06/09/2021   Elray Mcgregor 06/09/2021, 1:24 PM

## 2021-06-09 NOTE — Plan of Care (Signed)
Discussed with patient her various risk factors for stroke.  Pt expressed concerns for self care once home and ability to care for grandson.  Chaplin consult ordered to support patient. [name]

## 2021-06-10 DIAGNOSIS — I6300 Cerebral infarction due to thrombosis of unspecified precerebral artery: Secondary | ICD-10-CM | POA: Diagnosis not present

## 2021-06-10 LAB — BASIC METABOLIC PANEL
Anion gap: 6 (ref 5–15)
BUN: 11 mg/dL (ref 6–20)
CO2: 24 mmol/L (ref 22–32)
Calcium: 8.4 mg/dL — ABNORMAL LOW (ref 8.9–10.3)
Chloride: 110 mmol/L (ref 98–111)
Creatinine, Ser: 1.17 mg/dL — ABNORMAL HIGH (ref 0.44–1.00)
GFR, Estimated: 54 mL/min — ABNORMAL LOW (ref >60)
Glucose, Bld: 98 mg/dL (ref 70–99)
Potassium: 3.8 mmol/L (ref 3.5–5.1)
Sodium: 140 mmol/L (ref 135–145)

## 2021-06-10 MED ORDER — TICAGRELOR 90 MG PO TABS
90.0000 mg | ORAL_TABLET | Freq: Two times a day (BID) | ORAL | Status: DC
  Administered 2021-06-10: 90 mg via ORAL
  Filled 2021-06-10: qty 1

## 2021-06-10 MED ORDER — TICAGRELOR 90 MG PO TABS: 90.0000 mg | ORAL_TABLET | Freq: Two times a day (BID) | ORAL | 0 refills | Status: DC

## 2021-06-10 MED ORDER — ASPIRIN EC 81 MG PO TBEC
81.0000 mg | DELAYED_RELEASE_TABLET | Freq: Every day | ORAL | Status: DC
  Administered 2021-06-10: 81 mg via ORAL
  Filled 2021-06-10: qty 1

## 2021-06-10 MED ORDER — PANTOPRAZOLE SODIUM 40 MG PO TBEC: 40.0000 mg | DELAYED_RELEASE_TABLET | Freq: Every day | ORAL | Status: DC

## 2021-06-10 MED ORDER — TICAGRELOR 90 MG PO TABS: 90.0000 mg | ORAL_TABLET | Freq: Two times a day (BID) | ORAL | 0 refills | Status: AC

## 2021-06-10 NOTE — Progress Notes (Signed)
Physical Therapy Treatment Patient Details Name: [name] MRN: [id number] DOB: 1961-01-14 Today's Date: 06/10/2021   History of Present Illness NAW LASALA is an 60 y.o. female with a history of mitral incompetence, CHF, CAD s/p stenting, COPD, depression, HLD and stroke without residual weakness, who presented with L weakness and numbness.  She had TNK and initial MRI showed no acute changes.    PT Comments    Pt admitted with above diagnosis. Pt was able to ambulate with RW with overall good safety. Education completed up and down steps. Pt progressing well/  Pt currently with functional limitations due to balance and endurance deficits. Pt will benefit from skilled PT to increase their independence and safety with mobility to allow discharge to the venue listed below.      Recommendations for follow up therapy are one component of a multi-disciplinary discharge planning process, led by the attending physician.  Recommendations may be updated based on patient status, additional functional criteria and insurance authorization.  Follow Up Recommendations  Home health PT;Supervision - Intermittent     Equipment Recommendations  None recommended by PT    Recommendations for Other Services       Precautions / Restrictions Precautions Precautions: Fall Restrictions Weight Bearing Restrictions: No     Mobility  Bed Mobility Overal bed mobility: Independent                  Transfers Overall transfer level: Needs assistance Equipment used: None Transfers: Sit to/from Stand Sit to Stand: Min guard         General transfer comment: initial standing cued not to reach for walker, with pt needing cues for hand placement. Had pt sit down and practiced sit to stands with appropriate technique.  Ambulation/Gait Ambulation/Gait assistance: Min guard Gait Distance (Feet): 450 Feet Assistive device: Rolling walker (2 wheeled) Gait Pattern/deviations: Step-through  pattern;Decreased stride length   Gait velocity interpretation: <1.8 ft/sec, indicate of risk for recurrent falls General Gait Details: good rollover and heel strike bilaterally with ambulation, pt did not have any LOB today with ambulation with RW.  Good safety.   Stairs Stairs: Yes Stairs assistance: Min guard;Min assist Stair Management: One rail Right;Forwards;Step to pattern;With walker Number of Stairs: 5 General stair comments: Taught pt to go up and down stairs with rail and carrying RW.  This was difficult for pt to carry RW therefore discussed that pt will use her cane on steps and use RW in home.   Wheelchair Mobility    Modified Rankin (Stroke Patients Only) Modified Rankin (Stroke Patients Only) Pre-Morbid Rankin Score: No significant disability Modified Rankin: Moderately severe disability     Balance Overall balance assessment: Needs assistance Sitting-balance support: No upper extremity supported;Feet supported Sitting balance-Leahy Scale: Good     Standing balance support: Bilateral upper extremity supported;During functional activity Standing balance-Leahy Scale: Poor Standing balance comment: UE support needed with RW                            Cognition Arousal/Alertness: Awake/alert Behavior During Therapy: WFL for tasks assessed/performed Overall Cognitive Status: Within Functional Limits for tasks assessed                                        Exercises      General Comments  Pertinent Vitals/Pain Pain Assessment: No/denies pain    Home Living     Available Help at Discharge: Available PRN/intermittently (sister works, has help from neighbors if needed) Type of Home: Apartment              Prior Function            PT Goals (current goals can now be found in the care plan section) Acute Rehab PT Goals Patient Stated Goal: return to independent Progress towards PT goals: Progressing toward  goals    Frequency    Min 4X/week      PT Plan Current plan remains appropriate    Co-evaluation              AM-PAC PT "6 Clicks" Mobility   Outcome Measure  Help needed turning from your back to your side while in a flat bed without using bedrails?: None Help needed moving from lying on your back to sitting on the side of a flat bed without using bedrails?: A Little Help needed moving to and from a bed to a chair (including a wheelchair)?: A Little Help needed standing up from a chair using your arms (e.g., wheelchair or bedside chair)?: A Little Help needed to walk in hospital room?: A Little Help needed climbing 3-5 steps with a railing? : A Little 6 Click Score: 19    End of Session Equipment Utilized During Treatment: Gait belt Activity Tolerance: Patient tolerated treatment well Patient left: with call bell/phone within reach;in bed;with bed alarm set Nurse Communication: Mobility status PT Visit Diagnosis: Other abnormalities of gait and mobility (R26.89);Muscle weakness (generalized) (M62.81);Other symptoms and signs involving the nervous system (R29.898)     Time: 8676-7209 PT Time Calculation (min) (ACUTE ONLY): 23 min  Charges:  $Gait Training: 23-37 mins                     Jhoan Schmieder M,PT Acute Rehab Services [phone] (pager)    Bevelyn Buckles 06/10/2021, 1:36 PM

## 2021-06-10 NOTE — Care Management (Signed)
Provided with Brilinta card

## 2021-06-10 NOTE — Evaluation (Signed)
Speech Language Pathology Evaluation Patient Details Name: [name] MRN: [id number] DOB: November 30, 1960 Today's Date: 06/10/2021 Time: 1010-1020 SLP Time Calculation (min) (ACUTE ONLY): 10 min  Problem List:  Patient Active Problem List   Diagnosis Date Noted   Stroke (HCC) 06/08/2021   Calcification of abdominal aorta (HCC) 02/12/2017   Dyslipidemia 07/11/2016   Acute cystitis without hematuria 01/23/2016   Age-related osteoporosis without current pathological fracture 12/29/2015   Benign hypertension 12/06/2015   Coronary artery disease involving native coronary artery of native heart with angina pectoris (HCC) 12/06/2015   Memory loss 12/30/2013   Stenosis of carotid artery 12/30/2012   Cerebral artery occlusion with cerebral infarction (HCC) 12/30/2012   Past Medical History:  Past Medical History:  Diagnosis Date   Anxiety    Chest pain 01/2015   CHF (congestive heart failure) (HCC)    COPD (chronic obstructive pulmonary disease) (HCC)    Depression    GERD (gastroesophageal reflux disease)    Hyperlipemia    MI (mitral incompetence)    Stroke (HCC)    Past Surgical History:  Past Surgical History:  Procedure Laterality Date   CORONARY ANGIOPLASTY WITH STENT PLACEMENT     TOTAL VAGINAL HYSTERECTOMY     HPI:  [name] is an 60 y.o. female with a history of mitral incompetence, CHF, CAD s/p stenting, COPD, depression, HLD and stroke without residual weakness, who presented with L weakness and numbness.  She had TNK and MRI of brain showed no acute changes.   Assessment / Plan / Recommendation Clinical Impression  Pt presents at baseline with cognitive linguistic abilities. She notes she did have trouble yesterday with word finding and thought organization. These symptoms have since resolved. MRI of brain was without acute intracranial abnormalities. PLOF pt independent with all ADLs. Expressive and receptive language skills as well as motor speech skills were  intact this date. Pt able to provide good insight to hospital course and has intermittent help @ DC from sister and neighbors if needed. No further ST needs identified.    SLP Assessment  SLP Recommendation/Assessment: Patient does not need any further Speech Lanaguage Pathology Services SLP Visit Diagnosis: Cognitive communication deficit (R41.841)    Recommendations for follow up therapy are one component of a multi-disciplinary discharge planning process, led by the attending physician.  Recommendations may be updated based on patient status, additional functional criteria and insurance authorization.    Follow Up Recommendations  None    Frequency and Duration   N/a         SLP Evaluation Cognition  Overall Cognitive Status: Within Functional Limits for tasks assessed Arousal/Alertness: Awake/alert Orientation Level: Oriented X4 Attention: Sustained;Focused Focused Attention: Appears intact Sustained Attention: Appears intact Memory: Appears intact Awareness: Appears intact Problem Solving: Appears intact Executive Function: Organizing;Self Monitoring Organizing: Appears intact Self Monitoring: Appears intact Safety/Judgment: Appears intact       Comprehension  Auditory Comprehension Overall Auditory Comprehension: Appears within functional limits for tasks assessed    Expression Expression Primary Mode of Expression: Verbal Verbal Expression Overall Verbal Expression: Appears within functional limits for tasks assessed Written Expression Dominant Hand: Right   Oral / Motor  Oral Motor/Sensory Function Overall Oral Motor/Sensory Function: Within functional limits Motor Speech Overall Motor Speech: Appears within functional limits for tasks assessed   GO                    Ardyth Gal MA, CCC-SLP Acute Rehabilitation Services   06/10/2021, 10:35 AM

## 2021-06-10 NOTE — Progress Notes (Signed)
Pt and daughter given discharge instructions by outgoing RN, all questions answered, had issues with Daneen Schick which was sent to a different pharmacy but  it was fixed by pharmacist and pt was okay. She was wheeled to private car. No issues noted, personal items taken by the daughter.

## 2021-06-10 NOTE — Discharge Summary (Addendum)
Stroke Discharge Summary  Patient ID: [name]   MRN: [id number]      DOB: Dec 23, 1960  Date of Admission: 06/08/2021 Date of Discharge: 06/10/2021  Attending Physician:  Dr. Antony Contras Discharging Physician: Dr. Reeves Forth Stroke MD  Patient's PCP:  Algis Greenhouse, MD  DISCHARGE DIAGNOSIS:   Active Problems:   Stroke [facility])   Allergies as of 06/10/2021       Reactions   Metronidazole Rash, Hives   Scopolamine Other (See Comments)   Psychiatric reaction - "out of my head'   Zithromax [azithromycin] Rash   Ace Inhibitors Other (See Comments)   Caused Bp to bottom out hypotension   Clindamycin Rash   Erythromycin Base Rash        Medication List     STOP taking these medications    clopidogrel 75 MG tablet Commonly known as: PLAVIX       TAKE these medications    acetaminophen 650 MG CR tablet Commonly known as: TYLENOL Take 1,300 mg by mouth every morning.   albuterol 108 (90 Base) MCG/ACT inhaler Commonly known as: VENTOLIN HFA Inhale 2 puffs into the lungs every 6 (six) hours as needed for wheezing.   Albuterol Sulfate 108 (90 Base) MCG/ACT Aepb Commonly known as: PROAIR RESPICLICK Inhale 1 puff into the lungs See admin instructions. Inhale one puff into the lungs twice daily, may also use one more time during the day as needed for shortness of breath/wheezing   aspirin 81 MG EC tablet Take 81 mg by mouth at bedtime. Swallow whole.   Biotin 5000 MCG Tabs Take 5,000 mcg by mouth every morning.   Calcium 600+D 600-800 MG-UNIT Tabs Generic drug: Calcium Carb-Cholecalciferol Take 1 tablet by mouth 2 (two) times daily.   Cetirizine HCl 10 MG Caps Take 10 mg by mouth at bedtime.   clonazePAM 0.5 MG tablet Commonly known as: KLONOPIN Take 0.25 mg by mouth 2 (two) times daily.   diltiazem 120 MG 24 hr capsule Commonly known as: DILACOR XR Take 120 mg by mouth every morning. What changed: Another medication with the same name was removed.  Continue taking this medication, and follow the directions you see here.   docusate sodium 100 MG capsule Commonly known as: COLACE Take 100 mg by mouth at bedtime as needed (constipation).   esomeprazole 40 MG capsule Commonly known as: NEXIUM Take 40 mg by mouth daily before breakfast.   Evolocumab 140 MG/ML Soaj Inject 140 mg into the skin every 14 (fourteen) days. Repatha   ezetimibe 10 MG tablet Commonly known as: ZETIA Take 10 mg daily by mouth.   famotidine 40 MG tablet Commonly known as: PEPCID Take 40 mg by mouth at bedtime.   furosemide 40 MG tablet Commonly known as: LASIX Take 20 mg by mouth every morning.   gabapentin 300 MG capsule Commonly known as: NEURONTIN Take 300 mg by mouth See admin instructions. Take one capsule (300 mg) by mouth twice daily - skip the morning dose if planning to drive that day   magnesium oxide 400 MG tablet Commonly known as: MAG-OX Take 400 mg by mouth at bedtime.   metoprolol tartrate 25 MG tablet Commonly known as: LOPRESSOR Take 25 mg by mouth 2 (two) times daily.   mometasone 0.1 % cream Commonly known as: ELOCON Apply 1 application topically every morning.   montelukast 10 MG tablet Commonly known as: SINGULAIR Take 10 mg by mouth at bedtime.   nitroGLYCERIN 0.4 MG  SL tablet Commonly known as: NITROSTAT Place 0.4 mg under the tongue every 5 (five) minutes as needed for chest pain.   OXYGEN Inhale 2 L into the lungs at bedtime.   potassium chloride 10 MEQ tablet Commonly known as: KLOR-CON Take 10 mEq by mouth every morning.   pyridOXINE 100 MG tablet Commonly known as: VITAMIN B-6 Take 100 mg by mouth every morning.   ranolazine 500 MG 12 hr tablet Commonly known as: RANEXA Take 500 mg by mouth at bedtime.   RepHresh Pro-B Caps Take 1 capsule by mouth at bedtime.   rosuvastatin 40 MG tablet Commonly known as: CRESTOR Take 40 mg by mouth at bedtime.   Theratears 0.25 % Soln Generic drug:  Carboxymethylcellulose Sodium Place 1 drop into both eyes daily as needed (dry eyes/irritation).   ticagrelor 90 MG Tabs tablet Commonly known as: BRILINTA Take 1 tablet (90 mg total) by mouth 2 (two) times daily.   traMADol 50 MG tablet Commonly known as: ULTRAM Take 50 mg by mouth daily as needed (pain).   traZODone 50 MG tablet Commonly known as: DESYREL Take 75 mg by mouth at bedtime.   valACYclovir 1000 MG tablet Commonly known as: VALTREX Take 1,000 mg by mouth every morning.   Vitamin B-12 2500 MCG Subl Place 2,500 mcg under the tongue every morning.   Vitamin D3 50 MCG (2000 UT) Tabs Take 2,000 Units by mouth every morning.   vortioxetine HBr 10 MG Tabs tablet Commonly known as: TRINTELLIX Take 10 mg by mouth daily. Take with a 5 mg tablet for a total dose of 15 mg   vortioxetine HBr 5 MG Tabs tablet Commonly known as: TRINTELLIX Take 5 mg by mouth daily. Take with a 10 mg tablet for a total dose of 15 mg   zoledronic acid 5 MG/100ML Soln injection Commonly known as: RECLAST Inject 5 mg into the vein See admin instructions. Administer once a year at [place]    Component Value Date/Time   WBC 8.5 06/08/2021 2114   RBC 4.38 06/08/2021 2114   HGB 14.3 06/08/2021 2123   HCT 42.0 06/08/2021 2123   PLT 367 06/08/2021 2114   MCV 94.5 06/08/2021 2114   MCH 32.4 06/08/2021 2114   MCHC 34.3 06/08/2021 2114   RDW 12.7 06/08/2021 2114   LYMPHSABS 2.2 06/08/2021 2114   MONOABS 0.9 06/08/2021 2114   EOSABS 0.2 06/08/2021 2114   BASOSABS 0.1 06/08/2021 2114   CMP    Component Value Date/Time   NA 140 06/10/2021 0249   K 3.8 06/10/2021 0249   CL 110 06/10/2021 0249   CO2 24 06/10/2021 0249   GLUCOSE 98 06/10/2021 0249   BUN 11 06/10/2021 0249   CREATININE 1.17 (H) 06/10/2021 0249   CALCIUM 8.4 (L) 06/10/2021 0249   PROT 6.8 09/27/2010 2356   ALBUMIN 4.0 09/27/2010 2356   AST 16 09/27/2010 2356   ALT 14  09/27/2010 2356   ALKPHOS 75 09/27/2010 2356   BILITOT 0.4 09/27/2010 2356   GFRNONAA 54 (L) 06/10/2021 0249   GFRAA  09/29/2010 0348    >60        The eGFR has been calculated using the MDRD equation. This calculation has not been validated in all clinical situations. eGFR's persistently <60 mL/min signify possible Chronic Kidney Disease.   COAGS Lab Results  Component Value Date   INR 0.9 06/08/2021   INR 0.93 09/27/2010  Lipid Panel    Component Value Date/Time   CHOL 94 06/09/2021 0311   TRIG 67 06/09/2021 0311   HDL 49 06/09/2021 0311   CHOLHDL 1.9 06/09/2021 0311   VLDL 13 06/09/2021 0311   LDLCALC 32 06/09/2021 0311   HgbA1C  Lab Results  Component Value Date   HGBA1C 5.6 06/09/2021   Urinalysis    Component Value Date/Time   COLORURINE STRAW (A) 06/09/2021 1117   APPEARANCEUR CLEAR 06/09/2021 1117   LABSPEC 1.009 06/09/2021 1117   PHURINE 7.0 06/09/2021 1117   GLUCOSEU NEGATIVE 06/09/2021 1117   HGBUR NEGATIVE 06/09/2021 1117   BILIRUBINUR NEGATIVE 06/09/2021 1117   KETONESUR NEGATIVE 06/09/2021 1117   PROTEINUR NEGATIVE 06/09/2021 1117   UROBILINOGEN 0.2 09/28/2010 0202   NITRITE NEGATIVE 06/09/2021 1117   LEUKOCYTESUR NEGATIVE 06/09/2021 1117   Urine Drug Screen     Component Value Date/Time   LABOPIA NONE DETECTED 06/09/2021 1117   COCAINSCRNUR NONE DETECTED 06/09/2021 1117   COCAINSCRNUR NEGATIVE 09/28/2010 0202   LABBENZ NONE DETECTED 06/09/2021 1117   LABBENZ NEGATIVE 09/28/2010 0202   AMPHETMU NONE DETECTED 06/09/2021 1117   THCU NONE DETECTED 06/09/2021 1117   LABBARB NONE DETECTED 06/09/2021 1117    Alcohol Level    Component Value Date/Time   ETH <10 06/08/2021 2114     SIGNIFICANT DIAGNOSTIC STUDIES MR BRAIN WO CONTRAST  Result Date: 06/09/2021 CLINICAL DATA:  Stroke, follow-up. Additional history provided: Stroke follow-up status post TNK. EXAM: MRI HEAD WITHOUT CONTRAST TECHNIQUE: Multiplanar, multiecho pulse sequences of  the brain and surrounding structures were obtained without intravenous contrast. COMPARISON:  Brain MRI 06/09/2021. Noncontrast head CT and CT angiogram head/neck 06/08/2021. FINDINGS: Brain: Mild intermittent motion degradation. Cerebral volume is normal. Redemonstrated small chronic lacunar infarct at the junction of the left lentiform nucleus and posterior limb of left internal capsule (series 11, image 14) (series 10, image 14). Mild multifocal T2 FLAIR hyperintense signal abnormality elsewhere within the cerebral white matter and pons, nonspecific but compatible with chronic small vessel ischemic disease. Punctate chronic microhemorrhages within the left parietal lobe, unchanged. There is no acute infarct. No evidence of an intracranial mass. No extra-axial fluid collection. No midline shift. Vascular: Maintained flow voids within the proximal large arterial vessels. Skull and upper cervical spine: No focal suspicious marrow lesion. Incompletely assessed cervical spondylosis. Sinuses/Orbits: Visualized orbits show no acute finding. No significant paranasal sinus disease. IMPRESSION: Mildly motion degraded exam. No evidence of acute intracranial abnormality. Redemonstrated chronic lacunar infarct at the junction of the left caudate nucleus and posterior limb of left internal capsule. Background mild chronic small vessel ischemic changes within the cerebral white matter and pons. Electronically Signed   By: Kellie Simmering D.O.   On: 06/09/2021 16:59   MR BRAIN WO CONTRAST  Result Date: 06/09/2021 CLINICAL DATA:  Follow-up examination for acute stroke. EXAM: MRI HEAD WITHOUT CONTRAST TECHNIQUE: Multiplanar, multiecho pulse sequences of the brain and surrounding structures were obtained without intravenous contrast. COMPARISON:  CTs from 06/08/2021. FINDINGS: Brain: Cerebral volume within normal limits for age. Scattered patchy T2/FLAIR hyperintensity present within the periventricular deep white matter both  cerebral hemispheres as well as the pons, most consistent with chronic small vessel ischemic disease, mild in nature. No abnormal foci of restricted diffusion to suggest acute or subacute ischemia. Gray-white matter differentiation maintained. No encephalomalacia to suggest chronic cortical infarction. No evidence for acute or chronic intracranial hemorrhage. No mass lesion, midline shift or mass effect. No hydrocephalus or extra-axial fluid collection. Pituitary  gland suprasellar region normal. Midline structures intact. Vascular: Major intracranial vascular flow voids are maintained. Skull and upper cervical spine: Craniocervical junction within normal limits. Bone marrow signal intensity normal. No scalp soft tissue abnormality. Sinuses/Orbits: Prior bilateral ocular lens replacement. Paranasal sinuses are largely clear. No mastoid effusion. Inner ear structures grossly normal. Other: None. IMPRESSION: 1. No acute intracranial abnormality. 2. Mild chronic microvascular ischemic disease for age. Electronically Signed   By: Jeannine Boga M.D.   On: 06/09/2021 01:24   ECHOCARDIOGRAM COMPLETE  Result Date: 06/09/2021    ECHOCARDIOGRAM REPORT   Patient Name:   YENTY BLOCH Date of Exam: 06/09/2021 Medical Rec #:  [id number]     Height:       63.0 in Accession #:    [id number]    Weight:       123.0 lb Date of Birth:  1961/05/29     BSA:          1.573 m Patient Age:    64 years      BP:           91/63 mmHg Patient Gender: F             HR:           70 bpm. Exam Location:  Inpatient Procedure: 2D Echo Indications:    stroke  History:        Patient has prior history of Echocardiogram examinations. CAD,                 COPD; Risk Factors:Dyslipidemia and Hypertension.  Sonographer:    Johny Chess RDCS Referring Phys: [place]  1. Left ventricular ejection fraction, by estimation, is 45 to 50%. The left ventricle has mildly decreased function. The left ventricle demonstrates  regional wall motion abnormalities (see scoring diagram/findings for description). Left ventricular diastolic parameters are consistent with Grade I diastolic dysfunction (impaired relaxation).  2. Right ventricular systolic function is normal. The right ventricular size is normal. There is normal pulmonary artery systolic pressure.  3. The mitral valve is normal in structure. No evidence of mitral valve regurgitation. No evidence of mitral stenosis.  4. The aortic valve is normal in structure. Aortic valve regurgitation is not visualized. No aortic stenosis is present.  5. The inferior vena cava is normal in size with greater than 50% respiratory variability, suggesting right atrial pressure of 3 mmHg. Conclusion(s)/Recommendation(s): No intracardiac source of embolism detected on this transthoracic study. A transesophageal echocardiogram is recommended to exclude cardiac source of embolism if clinically indicated. FINDINGS  Left Ventricle: Left ventricular ejection fraction, by estimation, is 45 to 50%. The left ventricle has mildly decreased function. The left ventricle demonstrates regional wall motion abnormalities. The left ventricular internal cavity size was normal in size. There is no left ventricular hypertrophy. Abnormal (paradoxical) septal motion, consistent with left bundle branch block. Left ventricular diastolic parameters are consistent with Grade I diastolic dysfunction (impaired relaxation).  LV Wall Scoring: The mid inferoseptal segment, apical septal segment, and apex are hypokinetic. Right Ventricle: The right ventricular size is normal. No increase in right ventricular wall thickness. Right ventricular systolic function is normal. There is normal pulmonary artery systolic pressure. The tricuspid regurgitant velocity is 2.24 m/s, and  with an assumed right atrial pressure of 3 mmHg, the estimated right ventricular systolic pressure is 42.6 mmHg. Left Atrium: Left atrial size was normal in size.  Right Atrium: Right atrial size was normal in size. Pericardium: There is no  evidence of pericardial effusion. Mitral Valve: The mitral valve is normal in structure. No evidence of mitral valve regurgitation. No evidence of mitral valve stenosis. Tricuspid Valve: The tricuspid valve is normal in structure. Tricuspid valve regurgitation is mild . No evidence of tricuspid stenosis. Aortic Valve: The aortic valve is normal in structure. Aortic valve regurgitation is not visualized. No aortic stenosis is present. Pulmonic Valve: The pulmonic valve was normal in structure. Pulmonic valve regurgitation is not visualized. No evidence of pulmonic stenosis. Aorta: The aortic root is normal in size and structure. Venous: The inferior vena cava is normal in size with greater than 50% respiratory variability, suggesting right atrial pressure of 3 mmHg. IAS/Shunts: No atrial level shunt detected by color flow Doppler.  LEFT VENTRICLE PLAX 2D LVIDd:         3.80 cm     Diastology LVIDs:         3.00 cm     LV e' medial:    7.94 cm/s LV PW:         0.80 cm     LV E/e' medial:  10.3 LV IVS:        0.80 cm     LV e' lateral:   10.90 cm/s LVOT diam:     1.80 cm     LV E/e' lateral: 7.5 LV SV:         44 LV SV Index:   28 LVOT Area:     2.54 cm  LV Volumes (MOD) LV vol d, MOD A2C: 72.9 ml LV vol d, MOD A4C: 88.1 ml LV vol s, MOD A2C: 38.6 ml LV vol s, MOD A4C: 48.0 ml LV SV MOD A2C:     34.3 ml LV SV MOD A4C:     88.1 ml LV SV MOD BP:      38.2 ml RIGHT VENTRICLE            IVC RV S prime:     8.27 cm/s  IVC diam: 1.70 cm TAPSE (M-mode): 3.0 cm LEFT ATRIUM             Index        RIGHT ATRIUM           Index LA diam:        3.70 cm 2.35 cm/m   RA Area:     10.10 cm LA Vol (A2C):   47.2 ml 30.01 ml/m  RA Volume:   20.00 ml  12.72 ml/m LA Vol (A4C):   34.4 ml 21.87 ml/m LA Biplane Vol: 40.9 ml 26.01 ml/m  AORTIC VALVE LVOT Vmax:   77.70 cm/s LVOT Vmean:  54.500 cm/s LVOT VTI:    0.172 m  AORTA Ao Root diam: 3.00 cm Ao Asc diam:   2.90 cm MITRAL VALVE               TRICUSPID VALVE MV Area (PHT): 6.17 cm    TR Peak grad:   20.1 mmHg MV Decel Time: 123 msec    TR Vmax:        224.00 cm/s MV E velocity: 81.40 cm/s MV A velocity: 97.90 cm/s  SHUNTS MV E/A ratio:  0.83        Systemic VTI:  0.17 m                            Systemic Diam: 1.80 cm Candee Furbish MD Electronically signed by Candee Furbish MD Signature  Date/Time: 06/09/2021/11:07:24 AM    Final    CT HEAD CODE STROKE WO CONTRAST  Result Date: 06/08/2021 CLINICAL DATA:  Code stroke. Initial evaluation for neuro deficit, stroke suspected, left lower extremity weakness. EXAM: CT HEAD WITHOUT CONTRAST TECHNIQUE: Contiguous axial images were obtained from the base of the skull through the vertex without intravenous contrast. COMPARISON:  Prior CT from 09/28/2010. FINDINGS: Brain: Age-related cerebral atrophy. No acute intracranial hemorrhage. There is question of a 9 mm hypodensity involving the right thalamus (series 2, image 16), age indeterminate. Otherwise, gray-white matter differentiation maintained with no visible acute large vessel territory infarct. No mass lesion, midline shift or mass effect no hydrocephalus or extra-axial fluid collection. Vascular: No hyperdense vessel. Skull: Scalp soft tissues and calvarium within normal limits. Sinuses/Orbits: Globes orbital soft tissues within normal limits. Paranasal sinuses are clear. No mastoid effusion. Other: None. ASPECTS [facility] Stroke Program Early CT Score) - Ganglionic level infarction (caudate, lentiform nuclei, internal capsule, insula, M1-M3 cortex): 7 - Supraganglionic infarction (M4-M6 cortex): 3 Total score (0-10 with 10 being normal): 10 IMPRESSION: 1. Question 9 mm hypodensity involving the right thalamus, age indeterminate, but could reflect a small acute ischemic infarct. No intracranial hemorrhage. 2. ASPECTS is 10. Case discussed by telephone at the time of interpretation on 06/08/2021 at 9:30 pm to provider Dr.  Cheral Marker. Electronically Signed   By: Jeannine Boga M.D.   On: 06/08/2021 21:43   CT ANGIO HEAD NECK W WO CM (CODE STROKE)  Result Date: 06/08/2021 CLINICAL DATA:  Initial evaluation for acute stroke, left-sided weakness. EXAM: CT ANGIOGRAPHY HEAD AND NECK TECHNIQUE: Multidetector CT imaging of the head and neck was performed using the standard protocol during bolus administration of intravenous contrast. Multiplanar CT image reconstructions and MIPs were obtained to evaluate the vascular anatomy. Carotid stenosis measurements (when applicable) are obtained utilizing NASCET criteria, using the distal internal carotid diameter as the denominator. CONTRAST:  89m OMNIPAQUE IOHEXOL 350 MG/ML SOLN COMPARISON:  Head CT from earlier the same day. FINDINGS: CTA NECK FINDINGS Aortic arch: Visualized aortic arch normal caliber with normal branch pattern. Mild atheromatous change within the arch itself. No stenosis about the origin of the great vessels. Right carotid system: Right CCA patent from its origin to the bifurcation without stenosis. Mild for age atheromatous change about the right carotid bulb without stenosis. Right ICA patent distally without stenosis, dissection or occlusion. Left carotid system: Left CCA patent from its origin to the bifurcation without stenosis. Mild for age atheromatous change about the left carotid bulb without stenosis. Left ICA patent distally without stenosis, dissection or occlusion. Vertebral arteries: Both vertebral arteries arise from subclavian arteries. No proximal subclavian artery stenosis. Left vertebral artery dominant. Vertebral arteries patent without stenosis, dissection or occlusion. Skeleton: No discrete worrisome osseous lesions. Median sternotomy wires noted. Mild for age cervical spondylosis. Other neck: No other acute soft tissue abnormality within the neck. Few subcentimeter left thyroid nodules measure up to 5 mm, of doubtful significance given size and  patient age, no follow-up imaging recommended (ref: J Am Coll Radiol. 2015 Feb;12(2): 143-50). Upper chest: Advanced centrilobular emphysematous changes seen within the visualized lungs. Irregular pleuroparenchymal thickening noted at the peripheral upper lobes, right greater than left. Review of the MIP images confirms the above findings CTA HEAD FINDINGS Anterior circulation: Petrous segments patent bilaterally. Mild for age atheromatous change within the carotid siphons without stenosis. A1 segments widely patent. Normal anterior communicating artery complex. Anterior cerebral arteries patent to their distal aspects without stenosis.  Normal in stenosis or occlusion. Normal MCA bifurcations. Distal MCA branches well perfused and symmetric. Posterior circulation: Both V4 segments patent to the vertebrobasilar junction without stenosis. Both PICA origins patent and normal. Basilar widely patent to its distal aspect without stenosis. Superior cerebellar arteries patent bilaterally. Both PCAs primarily supplied via the basilar and are well perfused to there distal aspects. Venous sinuses: Patent allowing for timing the contrast bolus. Anatomic variants: None significant. Review of the MIP images confirms the above findings IMPRESSION: 1. Negative CTA for large vessel occlusion. 2. Mild for age atheromatous change about the carotid bifurcations and carotid siphons without hemodynamically significant stenosis. 3. Emphysema (ICD10-J43.9). Results discussed by telephone at the time of interpretation on 06/08/2021 at 10:15 p.m. to provider ERIC LINDZEN. Electronically Signed   By: Jeannine Boga M.D.   On: 06/08/2021 23:05      HISTORY OF PRESENT ILLNESS Doing well this morning. Symptom  free. Wants to home. Has anxiety but did not get her home dose yet. Felt a little nauseous but no vomiting.  HOSPITAL COURSE  60 y.o. female with a history of mitral incompetence, CHF, CAD s/p stenting, COPD, depression, HLD  and stroke without residual weakness, who presented to the ED via EMS for acute onset of left sided weakness and sensory numbness. Admitted on 10/6 to stroke service. The patient initially stated LKN was 1945 at which time her LUE became weak, but then endorsed acute onset of LLE weakness at 5:30 PM. On further detailed interview, LKN and TOSO were revealed to have been 5:40 PM, 10 minutes into watching a TV program that began at 5:30. Left arm drift was noted on arrival to the ED and a Code Stroke was called. TNK was given. Did well during  ICU stay, transitioned to floor bed with tele as she continued to improve and now with near resolution. MRI neg for new stroke. No PT/OT needs. D/C home.   ASSESSMENT/PLAN   TIA involving right thalamic region s/p tNK with good clinical improvement etiology likely small vessel disease   CT head: Question 9 mm hypodensity involving the right thalamus, age indeterminate, but could reflect a small acute ischemic infarct. No intracranial hemorrhage. ASPECTS is 10.    CTA of head and neck:  Negative CTA for large vessel occlusion. Mild for age atheromatous change about the carotid bifurcations and carotid siphons without hemodynamically significant stenosis.  MRI  No acute intracranial abnormality. Mild chronic microvascular ischemic disease for age. 2D Echo  EF 45-50%,  The left ventricle has mildly decreased function. The left ventricle demonstrates regional wall motion abnormalities  Left ventricular diastolic parameters are consistent with Grade I diastolic dysfunction  (impaired relaxation).   2. Right ventricular systolic function is normal. The right ventricular  size is normal. There is normal pulmonary artery systolic pressure.   3. The mitral valve is normal in structure. No evidence of mitral valve regurgitation. No evidence of mitral stenosis.   4. The aortic valve is normal in structure. Aortic valve regurgitation is not visualized. No aortic stenosis  is present.   5. The inferior vena cava is normal in size with greater than 50%  respiratory variability, suggesting right atrial pressure of 3 mmHg.   Conclusion(s)/Recommendation(s): No intracardiac source of embolism detected on this transthoracic study.  LDL 32 HgbA1c 5.6 VTE prophylaxis - SCDs            Diet    Diet regular Room service appropriate? Yes; Fluid consistency: Thin  On ASA and plavix prior to admission in the setting post stroke and cardiac stents  MRI Neg for new CVA. Old left caudate nucleus and post limb CVA noted. ASA and brilinta x 30 days, if financially feasible may stay on brilinta afterward or may need to return to plavix for financial reasons.  Therapy recommendations:  HHPT/PENDING Disposition:  TBD   Hypertension Home meds:  metoprolol 36m BID, cardizem  Stable currently on cardizem, metoprolol to be added back in later  Long-term BP goal normotensive        History of CHF Hold home lasix/potassium as below  On cardizem BB on hold    Hyperlipidemia Home meds: Crestor 244m Zetia 1034mLDL 32, goal < 70 High intensity statin not indicated as patient is at goal.  On zetia. Continue statin at discharge       Glucose management No hx DM2 HgbA1c 5.6, goal < 7.0 CBGs    Recent Labs (last 2 labs)              Recent Labs    06/08/21 2111   GLUCAP 126*       Stable         AKI Resolved resume home lasix/potassium        Other Stroke Risk Factors Previous Cigarette smoker Hx stroke/TIA Family hx stroke (Daughter, Father) Coronary artery disease, stents x 7 CHF        Other Active Problems COPD: continue home proventil inhaler  GERD: continue PPI Anxiety: continue home clonazepam    RN Pressure Injury Documentation:  None.   DISCHARGE EXAM Blood pressure 105/90, pulse 71, temperature 98.2 F (36.8 C), temperature source Oral, resp. rate 17, height 5' 3"  (1.6 m), weight 55.2 kg, SpO2 97 %. PHYSICAL  EXAM NAD. Afebrile. Head is nontraumatic.  Neck is supple without bruit.     Cardiac exam: RRR. Ext: no C/C/E. Skin: no rashes.  Neurological Exam :  awake alert oriented x 3 normal speech and language.  No face asymmetry. Tongue midline. No drift. No aphasia. Strength: 5/5. Normal tone.  Normal sensation. Normal coordination. No ataxia.  Discharge Diet    Normal diet.  DISCHARGE PLAN Disposition:  home  aspirin 325 mg daily and Brilinta (ticagrelor) 90 mg bid for secondary stroke prevention until changed by neurologist outpt. Ongoing stroke risk factor control by Primary Care Physician at time of discharge Follow-up PCP Dough, RobJaymes GraffD in 2 weeks. Follow-up in [facility] in 4 weeks, office to schedule an appointment.   35 minutes were spent preparing discharge.

## 2021-07-25 ENCOUNTER — Other Ambulatory Visit: Payer: Self-pay | Admitting: Neurology

## 2021-08-15 ENCOUNTER — Other Ambulatory Visit: Payer: Self-pay

## 2021-08-15 ENCOUNTER — Ambulatory Visit (INDEPENDENT_AMBULATORY_CARE_PROVIDER_SITE_OTHER): Payer: Medicare Other | Admitting: Neurology

## 2021-08-15 ENCOUNTER — Encounter: Payer: Self-pay | Admitting: Neurology

## 2021-08-15 VITALS — BP 94/60 | HR 73 | Ht 63.0 in | Wt 118.5 lb

## 2021-08-15 DIAGNOSIS — G459 Transient cerebral ischemic attack, unspecified: Secondary | ICD-10-CM | POA: Diagnosis not present

## 2021-08-15 DIAGNOSIS — I6381 Other cerebral infarction due to occlusion or stenosis of small artery: Secondary | ICD-10-CM | POA: Diagnosis not present

## 2021-08-15 NOTE — Progress Notes (Signed)
[facility] [address] [place]. [place] [address] [phone]       OFFICE FOLLOW-UP NOTE  Ms. [name] Date of Birth:  10/09/1960 Medical Record Number:  [id number]   HPI: [name] is a 60 year old Caucasian lady seen for office follow-up visit following hospital admission for stroke in October 2022.  History is obtained from the patient and review of electronic medical records and opossum reviewed available pertinent imaging films in PACS.  She has past medical history of COPD, congestive heart failure, gastroesophageal reflux disease, hyperlipidemia, mitral incompetence and anxiety.  She presented on 06/08/2021 to the emergency room via EMS for sudden onset of left hemiparesis and numbness.  She presented as a code stroke and was found to have significant deficits and was given IV thrombolysis with TNK uneventfully.  His weakness significantly improved.  She was kept in the ICU with close blood pressure monitoring remained stable.  MRI scan of the brain was obtained which showed no acute infarct.  Previously CT scan of the head had shown a questionable hypodensity involving the right thalamus which was age-indeterminate.  CT angiogram of the brain was negative for large vessel occlusion but showed mild atheromatous changes in carotid bifurcations and carotid siphon bilaterally.  2D echo showed ejection fraction to be slightly diminished at 45 to 50% and regional wall motion abnormalities but no clot.  LDL cholesterol was 32 mg percent hemoglobin A1c was 5.6.  Patient had previously been on aspirin and Plavix for coronary disease this was changed to aspirin and Brilinta.  She is tolerating this well but does complain of increased bruising.  She states the left-sided weakness has improved though the left leg is at times still heavy.  She has to use a cane for walking outdoors but can walk indoors without it.  Her numbness is completely resolved.  She still has some diminished  fine motor skills in the left hand.  Initially after the stroke she had some impaired thought processes but now she seems to be doing much better.  She had severe acute back pain and has been seen by pain physician in [facility] who plans to do spinal injection and patient has questions about holding aspirin and Brilinta prior to the injection.  She has no new complaints today.  She remains on both Repatha and Crestor for her lipids which seem to be quite optimally controlled at the present time.  ROS:   14 system review of systems is positive for Bruising, weakness, walking difficulty, numbness impaired thought process and all other systems negative  PMH:  Past Medical History:  Diagnosis Date   Anxiety    Chest pain 01/2015   CHF (congestive heart failure) (HCC)    COPD (chronic obstructive pulmonary disease) (HCC)    Depression    GERD (gastroesophageal reflux disease)    Hyperlipemia    MI (mitral incompetence)    Stroke [facility])     Social History:  Social History   Socioeconomic History   Marital status: Divorced    Spouse name: Not on file   Number of children: 1   Years of education: GED   Highest education level: Not on file  Occupational History    Employer: UNEMPLOYED  Tobacco Use   Smoking status: Former   Smokeless tobacco: Never  Substance and Sexual Activity   Alcohol use: No   Drug use: No   Sexual activity: Not Currently  Other Topics Concern   Not on file  Social History Narrative   Patient lives at home alone.    Patient has 1 child.    Patient has a GED.    Patient is right handed.    Patient is divorced.    Social Determinants of Health   Financial Resource Strain: Not on file  Food Insecurity: Not on file  Transportation Needs: Not on file  Physical Activity: Not on file  Stress: Not on file  Social Connections: Not on file  Intimate Partner Violence: Not on file    Medications:   Current Outpatient Medications on File Prior to Visit   Medication Sig Dispense Refill   acetaminophen (TYLENOL) 650 MG CR tablet Take 1,300 mg by mouth every morning.     Albuterol Sulfate (PROAIR RESPICLICK) 123XX123 (90 Base) MCG/ACT AEPB Inhale 1 puff into the lungs See admin instructions. Inhale one puff into the lungs twice daily, may also use one more time during the day as needed for shortness of breath/wheezing     aspirin 81 MG EC tablet Take 81 mg by mouth at bedtime. Swallow whole.     Biotin 5000 MCG TABS Take 5,000 mcg by mouth every morning.     Calcium Carb-Cholecalciferol (CALCIUM 600+D) 600-800 MG-UNIT TABS Take 1 tablet by mouth 2 (two) times daily.     Carboxymethylcellulose Sodium (THERATEARS) 0.25 % SOLN Place 1 drop into both eyes daily as needed (dry eyes/irritation).     Cetirizine HCl 10 MG CAPS Take 10 mg by mouth at bedtime.     Cholecalciferol (VITAMIN D3) 50 MCG (2000 UT) TABS Take 2,000 Units by mouth every morning.     clonazePAM (KLONOPIN) 0.5 MG tablet Take 0.25 mg by mouth 2 (two) times daily.     Cyanocobalamin (VITAMIN B-12) 2500 MCG SUBL Place 2,500 mcg under the tongue every morning.     diltiazem (DILACOR XR) 120 MG 24 hr capsule Take 120 mg by mouth every morning.     esomeprazole (NEXIUM) 40 MG capsule Take 40 mg by mouth daily before breakfast.     Evolocumab 140 MG/ML SOAJ Inject 140 mg into the skin every 14 (fourteen) days. Repatha     famotidine (PEPCID) 40 MG tablet Take 40 mg by mouth at bedtime.     furosemide (LASIX) 40 MG tablet Take 20 mg by mouth every morning.     gabapentin (NEURONTIN) 300 MG capsule Take 300 mg by mouth See admin instructions. Take one capsule (300 mg) by mouth twice daily - skip the morning dose if planning to drive that day     Glycopyrrolate-Formoterol (BEVESPI AEROSPHERE) 9-4.8 MCG/ACT AERO Inhale 2 puffs into the lungs 2 (two) times daily.     Lactobacillus ([place] PRO-B) CAPS Take 1 capsule by mouth at bedtime.     magnesium oxide (MAG-OX) 400 MG tablet Take 400 mg by mouth  at bedtime.     metoprolol tartrate (LOPRESSOR) 25 MG tablet Take 25 mg by mouth 2 (two) times daily.     mometasone (ELOCON) 0.1 % cream Apply 1 application topically every morning.     montelukast (SINGULAIR) 10 MG tablet Take 10 mg by mouth at bedtime.     nitroGLYCERIN (NITROSTAT) 0.4 MG SL tablet Place 0.4 mg under the tongue every 5 (five) minutes as needed for chest pain.     OXYGEN Inhale 2 L into the lungs at bedtime.     potassium chloride (KLOR-CON) 10 MEQ tablet Take 10 mEq by mouth every morning.     pyridOXINE (  VITAMIN B-6) 100 MG tablet Take 100 mg by mouth every morning.     ranolazine (RANEXA) 500 MG 12 hr tablet Take 500 mg by mouth at bedtime.     rosuvastatin (CRESTOR) 40 MG tablet Take 40 mg by mouth at bedtime.     ticagrelor (BRILINTA) 90 MG TABS tablet Take 1 tablet by mouth 2 (two) times daily.     tiZANidine (ZANAFLEX) 2 MG tablet Take 1 tablet by mouth as needed.     traMADol (ULTRAM) 50 MG tablet Take 50 mg by mouth daily as needed (pain).     traZODone (DESYREL) 50 MG tablet Take 75 mg by mouth at bedtime.     valACYclovir (VALTREX) 1000 MG tablet Take 1,000 mg by mouth every morning.     vortioxetine HBr (TRINTELLIX) 10 MG TABS tablet Take 10 mg by mouth daily. Take with a 5 mg tablet for a total dose of 15 mg     vortioxetine HBr (TRINTELLIX) 5 MG TABS tablet Take 5 mg by mouth daily. Take with a 10 mg tablet for a total dose of 15 mg     zoledronic acid (RECLAST) 5 MG/100ML SOLN injection Inject 5 mg into the vein See admin instructions. Administer once a year at [facility]     No current facility-administered medications on file prior to visit.    Allergies:   Allergies  Allergen Reactions   Metronidazole Rash and Hives   Scopolamine Other (See Comments)    Psychiatric reaction - "out of my head'   Zithromax [Azithromycin] Rash   Ace Inhibitors Other (See Comments)    Caused Bp to bottom out hypotension    Clindamycin Rash   Erythromycin Base  Rash    Physical Exam General: Frail petite middle-aged Caucasian lady, seated, in no evident distress Head: head normocephalic and atraumatic.  Neck: supple with no carotid or supraclavicular bruits Cardiovascular: regular rate and rhythm, no murmurs Musculoskeletal: Mild kyphoscoliosis Skin:  no rash/petichiae Vascular:  Normal pulses all extremities Vitals:   08/15/21 0924  BP: 94/60  Pulse: 73   Neurologic Exam Mental Status: Awake and fully alert. Oriented to place and time. Recent and remote memory intact. Attention span, concentration and fund of knowledge appropriate. Mood and affect appropriate.  Cranial Nerves: Fundoscopic exam reveals sharp disc margins. Pupils equal, briskly reactive to light. Extraocular movements full without nystagmus. Visual fields full to confrontation. Hearing intact. Facial sensation intact. Face, tongue, palate moves normal diminished fine finger movements on the left and orbits right over left upper extremity.  Mild left grip weakness.  Ly and symmetrically.  Motor: Normal bulk and tone. Normal strength in all tested extremity muscles. Sensory.: intact to touch ,pinprick .position and vibratory sensation.  Coordination: Rapid alternating movements normal in all extremities. Finger-to-nose and heel-to-shin performed accurately bilaterally. Gait and Station: Arises from chair without difficulty. Stance is normal. Gait demonstrates normal stride length and balance but slight dragging of the left leg.. Able to heel, toe and tandem walk with moderate difficulty.  Reflexes: 1+ and symmetric. Toes downgoing.   NIHSS  1 Modified Rankin  2   ASSESSMENT: 60 year old Caucasian lady with left-sided weakness and numbness due to suspected right subcortical lacunar infarct treated with IV TNK with excellent clinical recovery.  Stroke risk factors hyperlipidemia, silent cerebrovascular disease and age     PLAN:I had a long d/w patient about his recent stroke,  risk for recurrent stroke/TIAs, personally independently reviewed imaging studies and stroke evaluation results and answered  questions.Continue aspirin 81 mg daily and Brilinta (ticagrelor) 90 mg bid  for secondary stroke prevention and maintain strict control of hypertension with blood pressure goal below 130/90, diabetes with hemoglobin A1c goal below 6.5% and lipids with LDL cholesterol goal below 70 mg/dL. I also advised the patient to eat a healthy diet with plenty of whole grains, cereals, fruits and vegetables, exercise regularly and maintain ideal body weight .I asked her to discuss with her primary physician to consider reducing or stopping the Crestor as her cholesterol seems quite well controlled since adding Repatha injections.  She return for follow-up in the future in 6 months or call earlier if necessary. Greater than 50% of time during this 30 minute visit was spent on counseling,explanation of diagnosis, planning of further management, discussion with patient and family and coordination of care Antony Contras, MD Note: This document was prepared with digital dictation and possible smart phrase technology. Any transcriptional errors that result from this process are unintentional

## 2021-08-15 NOTE — Patient Instructions (Signed)
I had a long d/w patient about his recent stroke, risk for recurrent stroke/TIAs, personally independently reviewed imaging studies and stroke evaluation results and answered questions.Continue aspirin 81 mg daily and Brilinta (ticagrelor) 90 mg bid  for secondary stroke prevention and maintain strict control of hypertension with blood pressure goal below 130/90, diabetes with hemoglobin A1c goal below 6.5% and lipids with LDL cholesterol goal below 70 mg/dL. I also advised the patient to eat a healthy diet with plenty of whole grains, cereals, fruits and vegetables, exercise regularly and maintain ideal body weight .I asked her to discuss with her primary physician to consider reducing or stopping the Crestor as her cholesterol seems quite well controlled since adding Repatha injections.  She return for follow-up in the future in 6 months or call earlier if necessary. Stroke Prevention Some medical conditions and behaviors can lead to a higher chance of having a stroke. You can help prevent a stroke by eating healthy, exercising, not smoking, and managing any medical conditions you have. Stroke is a leading cause of functional impairment. Primary prevention is particularly important because a majority of strokes are first-time events. Stroke changes the lives of not only those who experience a stroke but also their family and other caregivers. How can this condition affect me? A stroke is a medical emergency and should be treated right away. A stroke can lead to brain damage and can sometimes be life-threatening. If a person gets medical treatment right away, there is a better chance of surviving and recovering from a stroke. What can increase my risk? The following medical conditions may increase your risk of a stroke: Cardiovascular disease. High blood pressure (hypertension). Diabetes. High cholesterol. Sickle cell disease. Blood clotting disorders (hypercoagulable state). Obesity. Sleep disorders  (obstructive sleep apnea). Other risk factors include: Being older than age 48. Having a history of blood clots, stroke, or mini-stroke (transient ischemic attack, TIA). Genetic factors, such as race, ethnicity, or a family history of stroke. Smoking cigarettes or using other tobacco products. Taking birth control pills, especially if you also use tobacco. Heavy use of alcohol or drugs, especially cocaine and methamphetamine. Physical inactivity. What actions can I take to prevent this? Manage your health conditions High cholesterol levels. Eating a healthy diet is important for preventing high cholesterol. If cholesterol cannot be managed through diet alone, you may need to take medicines. Take any prescribed medicines to control your cholesterol as told by your health care provider. Hypertension. To reduce your risk of stroke, try to keep your blood pressure below 130/80. Eating a healthy diet and exercising regularly are important for controlling blood pressure. If these steps are not enough to manage your blood pressure, you may need to take medicines. Take any prescribed medicines to control hypertension as told by your health care provider. Ask your health care provider if you should monitor your blood pressure at home. Have your blood pressure checked every year, even if your blood pressure is normal. Blood pressure increases with age and some medical conditions. Diabetes. Eating a healthy diet and exercising regularly are important parts of managing your blood sugar (glucose). If your blood sugar cannot be managed through diet and exercise, you may need to take medicines. Take any prescribed medicines to control your diabetes as told by your health care provider. Get evaluated for obstructive sleep apnea. Talk to your health care provider about getting a sleep evaluation if you snore a lot or have excessive sleepiness. Make sure that any other medical conditions you  have, such as  atrial fibrillation or atherosclerosis, are managed. Nutrition Follow instructions from your health care provider about what to eat or drink to help manage your health condition. These instructions may include: Reducing your daily calorie intake. Limiting how much salt (sodium) you use to 1,500 milligrams (mg) each day. Using only healthy fats for cooking, such as olive oil, canola oil, or sunflower oil. Eating healthy foods. You can do this by: Choosing foods that are high in fiber, such as whole grains, and fresh fruits and vegetables. Eating at least 5 servings of fruits and vegetables a day. Try to fill one-half of your plate with fruits and vegetables at each meal. Choosing lean protein foods, such as lean cuts of meat, poultry without skin, fish, tofu, beans, and nuts. Eating low-fat dairy products. Avoiding foods that are high in sodium. This can help lower blood pressure. Avoiding foods that have saturated fat, trans fat, and cholesterol. This can help prevent high cholesterol. Avoiding processed and prepared foods. Counting your daily carbohydrate intake.  Lifestyle If you drink alcohol: Limit how much you have to: 0-1 drink a day for women who are not pregnant. 0-2 drinks a day for men. Know how much alcohol is in your drink. In the [place] one drink equals one 12 oz bottle of beer ( ), one 5 oz glass of wine ( ), or one 1 oz glass of hard liquor (8mL). Do not use any products that contain nicotine or tobacco. These products include cigarettes, chewing tobacco, and vaping devices, such as e-cigarettes. If you need help quitting, ask your health care provider. Avoid secondhand smoke. Do not use drugs. Activity  Try to stay at a healthy weight. Get at least 30 minutes of exercise on most days, such as: Fast walking. Biking. Swimming. Medicines Take over-the-counter and prescription medicines only as told by your health care provider. Aspirin or blood thinners  (antiplatelets or anticoagulants) may be recommended to reduce your risk of forming blood clots that can lead to stroke. Avoid taking birth control pills. Talk to your health care provider about the risks of taking birth control pills if: You are over 54 years old. You smoke. You get very bad headaches. You have had a blood clot. Where to find more information American Stroke Association: www.strokeassociation.org Get help right away if: You or a loved one has any symptoms of a stroke. "BE FAST" is an easy way to remember the main warning signs of a stroke: B - Balance. Signs are dizziness, sudden trouble walking, or loss of balance. E - Eyes. Signs are trouble seeing or a sudden change in vision. F - Face. Signs are sudden weakness or numbness of the face, or the face or eyelid drooping on one side. A - Arms. Signs are weakness or numbness in an arm. This happens suddenly and usually on one side of the body. S - Speech. Signs are sudden trouble speaking, slurred speech, or trouble understanding what people say. T - Time. Time to call emergency services. Write down what time symptoms started. You or a loved one has other signs of a stroke, such as: A sudden, severe headache with no known cause. Nausea or vomiting. Seizure. These symptoms may represent a serious problem that is an emergency. Do not wait to see if the symptoms will go away. Get medical help right away. Call your local emergency services (911 in the [place] Do not drive yourself to the hospital. Summary You can help to prevent a stroke by eating  healthy, exercising, not smoking, limiting alcohol intake, and managing any medical conditions you may have. Do not use any products that contain nicotine or tobacco. These include cigarettes, chewing tobacco, and vaping devices, such as e-cigarettes. If you need help quitting, ask your health care provider. Remember "BE FAST" for warning signs of a stroke. Get help right away if you or a  loved one has any of these signs. This information is not intended to replace advice given to you by your health care provider. Make sure you discuss any questions you have with your health care provider. Document Revised: 03/21/2020 Document Reviewed: 03/21/2020 Elsevier Patient Education  2022 [employer].

## 2021-09-20 ENCOUNTER — Other Ambulatory Visit: Payer: Self-pay

## 2021-09-20 NOTE — Patient Outreach (Signed)
[facility]) Care Management  09/20/2021  [name] 10-Jun-1961 [id number]   No Telephone outreach to patient to obtain mRS, as was successfully completed by Dr. Pearlean Brownie 09/15/21. MRS=2     [name] [facility] Care Management Assistant

## 2022-02-14 ENCOUNTER — Ambulatory Visit: Payer: Medicare Other | Admitting: Adult Health

## 2022-02-15 ENCOUNTER — Ambulatory Visit (INDEPENDENT_AMBULATORY_CARE_PROVIDER_SITE_OTHER): Payer: Medicare Other | Admitting: Adult Health

## 2022-02-15 ENCOUNTER — Encounter: Payer: Self-pay | Admitting: Adult Health

## 2022-02-15 VITALS — BP 102/61 | HR 70 | Ht 63.0 in | Wt 118.0 lb

## 2022-02-15 DIAGNOSIS — I6381 Other cerebral infarction due to occlusion or stenosis of small artery: Secondary | ICD-10-CM

## 2022-02-15 DIAGNOSIS — G459 Transient cerebral ischemic attack, unspecified: Secondary | ICD-10-CM

## 2022-02-15 NOTE — Progress Notes (Signed)
[facility] [address] [place]. [place] [address] [phone]       OFFICE FOLLOW-UP NOTE  [name] Date of Birth:  1960/10/21 Medical Record Number:  [id number]   Primary neurologist: Dr. Pearlean Brownie Reason for visit: Stroke follow-up  Chief Complaint  Patient presents with   Follow-up    RM 3 alone Pt is well and stable, still having some imbalance and brain fog       HPI:   Update 02/15/2022 JM: Patient returns for stroke follow-up after prior visit with Dr. Pearlean Brownie 6 months ago.  She has been stable from a stroke standpoint without new stroke/TIA symptoms.  She does still c/o balance difficulties, and memory issues. She did have issues with balance and cognition since her prior stroke but seemed to worsen some after recent stroke. Does report 2 episodes in the past 2 weeks of left eye horizontal double vision lasting only short duration, appears as though object not as clear, after blinking a couple times and letting her eye adjust, vision improves. Does have issues with dry eye, will use systane drops. Has a follow up visit soon with ophthalmology.  No other associated symptoms.  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  No further concerns at this time    History provided for reference purposes only Initial visit 08/15/2021 Dr. Pearlean Brownie: [name] is a 61 year old Caucasian lady seen for office follow-up visit following hospital admission for stroke in October 2022.  History is obtained from the patient and review of electronic medical records and opossum reviewed available pertinent imaging films in PACS.  She has past medical history  of COPD, congestive heart failure, gastroesophageal reflux disease, hyperlipidemia, mitral incompetence and anxiety.  She presented on 06/08/2021 to the emergency room via EMS for sudden onset of left hemiparesis and numbness.  She presented as a code stroke and was found to have significant deficits and was given IV thrombolysis with TNK uneventfully.  His weakness significantly improved.  She was kept in the ICU with close blood pressure monitoring remained stable.  MRI scan of the brain was obtained which showed no acute infarct.  Previously CT scan of the head had shown a questionable hypodensity involving the right thalamus which was age-indeterminate.  CT angiogram of the brain was negative for large vessel occlusion but showed mild atheromatous changes in carotid bifurcations and carotid siphon bilaterally.  2D echo showed ejection fraction to be slightly diminished at 45 to 50% and regional wall motion abnormalities but no clot.  LDL cholesterol was 32 mg percent hemoglobin A1c was 5.6.  Patient had previously been on aspirin and Plavix for coronary disease this was changed to aspirin and Brilinta.  She is tolerating this well but does complain of increased bruising.  She states the left-sided weakness has improved though the left leg is at times still heavy.  She has to use a cane for walking outdoors but can walk indoors without it.  Her numbness is completely resolved.  She still has some diminished fine motor skills in the left hand.  Initially after the stroke she had some impaired thought processes but now she seems to be doing much  better.  She had severe acute back pain and has been seen by pain physician in [facility] who plans to do spinal injection and patient has questions about holding aspirin and Brilinta prior to the injection.  She has no new complaints today.  She remains on both Repatha and Crestor for her lipids which seem to be quite optimally controlled at the present time.     ROS:    14 system review of systems is positive for those listed in HPI and all other systems negative  PMH:  Past Medical History:  Diagnosis Date   Anxiety    Chest pain 01/2015   CHF (congestive heart failure) (HCC)    COPD (chronic obstructive pulmonary disease) (HCC)    Depression    GERD (gastroesophageal reflux disease)    Hyperlipemia    MI (mitral incompetence)    Stroke [facility])     Social History:  Social History   Socioeconomic History   Marital status: Divorced    Spouse name: Not on file   Number of children: 1   Years of education: GED   Highest education level: Not on file  Occupational History    Employer: UNEMPLOYED  Tobacco Use   Smoking status: Former   Smokeless tobacco: Never  Substance and Sexual Activity   Alcohol use: No   Drug use: No   Sexual activity: Not Currently  Other Topics Concern   Not on file  Social History Narrative   Patient lives at home alone.    Patient has 1 child.    Patient has a GED.    Patient is right handed.    Patient is divorced.    Social Determinants of Health   Financial Resource Strain: Not on file  Food Insecurity: Not on file  Transportation Needs: Not on file  Physical Activity: Not on file  Stress: Not on file  Social Connections: Not on file  Intimate Partner Violence: Not on file    Medications:   Current Outpatient Medications on File Prior to Visit  Medication Sig Dispense Refill   acetaminophen (TYLENOL) 650 MG CR tablet Take 1,300 mg by mouth every morning.     Albuterol Sulfate (PROAIR RESPICLICK) 108 (90 Base) MCG/ACT AEPB Inhale 1 puff into the lungs See admin instructions. Inhale one puff into the lungs twice daily, may also use one more time during the day as needed for shortness of breath/wheezing     aspirin 81 MG EC tablet Take 81 mg by mouth at bedtime. Swallow whole.     Biotin 5000 MCG TABS Take 5,000 mcg by mouth every morning.     Calcium Carb-Cholecalciferol (CALCIUM 600+D) 600-800  MG-UNIT TABS Take 1 tablet by mouth 2 (two) times daily.     Cetirizine HCl 10 MG CAPS Take 10 mg by mouth at bedtime.     Cholecalciferol (VITAMIN D3) 50 MCG (2000 UT) TABS Take 2,000 Units by mouth every morning.     clonazePAM (KLONOPIN) 0.5 MG tablet Take 0.25 mg by mouth 2 (two) times daily.     Cyanocobalamin (VITAMIN B-12) 2500 MCG SUBL Place 2,500 mcg under the tongue every morning.     diltiazem (DILACOR XR) 120 MG 24 hr capsule Take 120 mg by mouth every morning.     esomeprazole (NEXIUM) 40 MG capsule Take 40 mg by mouth daily before breakfast.     Evolocumab 140 MG/ML SOAJ Inject 140 mg into the skin every 14 (fourteen) days. Repatha  famotidine (PEPCID) 40 MG tablet Take 40 mg by mouth at bedtime.     furosemide (LASIX) 40 MG tablet Take 20 mg by mouth every morning.     gabapentin (NEURONTIN) 300 MG capsule Take 300 mg by mouth See admin instructions. Take one capsule (300 mg) by mouth twice daily - skip the morning dose if planning to drive that day     Glycopyrrolate-Formoterol (BEVESPI AEROSPHERE) 9-4.8 MCG/ACT AERO Inhale 2 puffs into the lungs 2 (two) times daily.     Lactobacillus (REPHRESH PRO-B) CAPS Take 1 capsule by mouth at bedtime.     magnesium oxide (MAG-OX) 400 MG tablet Take 400 mg by mouth at bedtime.     metoprolol tartrate (LOPRESSOR) 25 MG tablet Take 25 mg by mouth 2 (two) times daily.     mometasone (ELOCON) 0.1 % cream Apply 1 application topically every morning.     montelukast (SINGULAIR) 10 MG tablet Take 10 mg by mouth at bedtime.     nitroGLYCERIN (NITROSTAT) 0.4 MG SL tablet Place 0.4 mg under the tongue every 5 (five) minutes as needed for chest pain.     OXYGEN Inhale 2 L into the lungs at bedtime.     potassium chloride (KLOR-CON) 10 MEQ tablet Take 10 mEq by mouth every morning.     Propylene Glycol (SYSTANE COMPLETE) 0.6 % SOLN Apply to eye as needed.     pyridOXINE (VITAMIN B-6) 100 MG tablet Take 100 mg by mouth every morning.     ranolazine  (RANEXA) 500 MG 12 hr tablet Take 500 mg by mouth at bedtime.     rosuvastatin (CRESTOR) 40 MG tablet Take 40 mg by mouth at bedtime.     tiZANidine (ZANAFLEX) 2 MG tablet Take 1 tablet by mouth as needed.     traMADol (ULTRAM) 50 MG tablet Take 50 mg by mouth daily as needed (pain).     traZODone (DESYREL) 50 MG tablet Take 75 mg by mouth at bedtime.     valACYclovir (VALTREX) 1000 MG tablet Take 1,000 mg by mouth every morning.     vortioxetine HBr (TRINTELLIX) 10 MG TABS tablet Take 10 mg by mouth daily. Take with a 5 mg tablet for a total dose of 15 mg     vortioxetine HBr (TRINTELLIX) 5 MG TABS tablet Take 5 mg by mouth daily. Take with a 10 mg tablet for a total dose of 15 mg     zoledronic acid (RECLAST) 5 MG/100ML SOLN injection Inject 5 mg into the vein See admin instructions. Administer once a year at [facility]     No current facility-administered medications on file prior to visit.    Allergies:   Allergies  Allergen Reactions   Metronidazole Rash and Hives   Scopolamine Other (See Comments)    Psychiatric reaction - "out of my head'   Zithromax [Azithromycin] Rash   Ace Inhibitors Other (See Comments)    Caused Bp to bottom out hypotension    Clindamycin Rash   Erythromycin Base Rash    Physical Exam Today's Vitals   02/15/22 1441  BP: 102/61  Pulse: 70  Weight: 118 lb (53.5 kg)  Height: 5\' 3"  (1.6 m)   Body mass index is 20.9 kg/m.   General: Frail petite very pleasant middle-aged Caucasian lady, seated, in no evident distress Head: head normocephalic and atraumatic.  Neck: supple with no carotid or supraclavicular bruits Cardiovascular: regular rate and rhythm, no murmurs Musculoskeletal: Mild kyphoscoliosis Skin:  no rash/petichiae Vascular:  Normal pulses all extremities  Neurologic Exam Mental Status: Awake and fully alert.  Fluent speech and language.  Oriented to place and time. Recent and remote memory intact. Attention span, concentration  and fund of knowledge appropriate. Mood and affect appropriate.  Cranial Nerves: Pupils equal, briskly reactive to light. Extraocular movements full without nystagmus. Visual fields full to confrontation. Hearing intact. Facial sensation intact. Face, tongue, palate moves symmetrically  Motor: Normal bulk and tone. Normal strength in all tested extremity muscles. Sensory.: intact to touch ,pinprick .position and vibratory sensation.  Coordination: Rapid alternating movements normal in all extremities. Finger-to-nose slight incoordination LUE and heel-to-shin performed accurately bilaterally. Gait and Station: Arises from chair without difficulty. Stance is normal. Gait demonstrates normal stride length and balance but slight dragging of the left leg. Able to heel, toe and tandem walk with moderate difficulty.  Reflexes: 1+ and symmetric. Toes downgoing.        ASSESSMENT/PLAN: 61 year old Caucasian lady with left-sided weakness and numbness due to suspected right subcortical lacunar infarct treated with IV TNK with excellent clinical recovery.  Stroke risk factors hyperlipidemia, silent cerebrovascular disease and age.  Does report 2 episodes over the past 2 weeks of short lasting left eye double vision without any other associated symptoms    -Neuro exam stable.  Encouraged her to schedule follow-up visit with ophthalmology for further evaluation.  She does have dry eye syndrome as well as history of cataracts -encouraged her to use systane drops to see if this helps dry eye.  Did discuss red flag symptoms and warning signs for which she should call 911 immediately for emergent evaluation -Continue aspirin 81 mg daily and Brilinta (ticagrelor) 90 mg bid and Repatha and Crestor for secondary stroke prevention and for cardiac risk factors -Discussed close PCP follow-up for aggressive stroke risk factor management including BP goal<130/90, and HLD with LDL goal<70    Doing well from stroke  standpoint without further recommendations and risk factors are managed by PCP. She may follow up PRN, as usual for our patients who are strictly being followed for stroke. If any new neurological issues should arise, request PCP place referral for evaluation by one of our neurologists. Thank you.      CC:  Dough, Doris Cheadle, MD   I spent 34 minutes of face-to-face and non-face-to-face time with patient.  This included previsit chart review, lab review, study review, order entry, electronic health record documentation, patient education and discussion regarding history of prior strokes with residual deficits, diplopia, secondary stroke prevention measures and aggressive stroke risk factor management and answered all other questions to patient's satisfaction  Ihor Austin, [facility]  [facility] [address] [place], [place] [address]  Phone [phone] Fax [phone] Note: This document was prepared with digital dictation and possible smart phrase technology. Any transcriptional errors that result from this process are unintentional.

## 2022-02-15 NOTE — Patient Instructions (Addendum)
Continue aspirin 81 mg daily and Brilinta (ticagrelor) 90 mg bid  and repatha and Crestor  for secondary stroke prevention  Follow up with your eye doctor if you experience the double vision again or if symptoms persist or associated with any other neurological symptoms, please call 911 immediately   Continue to follow up with PCP regarding cholesterol and blood pressure management  Maintain strict control of hypertension with blood pressure goal below 130/90 and cholesterol with LDL cholesterol (bad cholesterol) goal below 70 mg/dL.   Signs of a Stroke? Follow the BEFAST method:  Balance Watch for a sudden loss of balance, trouble with coordination or vertigo Eyes Is there a sudden loss of vision in one or both eyes? Or double vision?  Face: Ask the person to smile. Does one side of the face droop or is it numb?  Arms: Ask the person to raise both arms. Does one arm drift downward? Is there weakness or numbness of a leg? Speech: Ask the person to repeat a simple phrase. Does the speech sound slurred/strange? Is the person confused ? Time: If you observe any of these signs, call 911.        Thank you for coming to see [place] at [facility]. I hope we have been able to provide you high quality care today.  You may receive a patient satisfaction survey over the next few weeks. We would appreciate your feedback and comments so that we may continue to improve ourselves and the health of our patients.     Diplopia Diplopia is a condition in which a person sees two of a single object. It is also called double vision. There are two types of diplopia. Monocular diplopia. This is double vision that is present when only one eye is open. It can occur in one or both eyes. Monocular diplopia is often caused by irregularity in the surface layer of your eye (cornea), a clouding of the lens in your eye (cataract), or a problem in the way your eye focuses light. Binocular diplopia. This  is double vision that is present only when both eyes are open. When you close one eye, the double vision will go away. Binocular diplopia may be more serious. It can be caused by: Problems with the nerves or muscles that are responsible for eye movement. Disease of the nerves (neurologic disease). Immune system conditions, such as Graves' disease. Migraine headaches. Tumors. An infection. A stroke. An injury. There are many causes of diplopia. Some are not dangerous and can be easily corrected. Diplopia may also be a symptom of a serious medical problem. You may need to see a health care provider who specializes in eye conditions (ophthalmologist) or a nerve specialist (neurologist) to find the cause. Follow these instructions at home:  Pay attention to any changes in your vision. Tell your health care provider about them. Do not drive or operate machinery if diplopia interferes with your vision. Keep all follow-up visits. This is important. Contact a health care provider if: Your diplopia gets worse. You develop any other symptoms along with your diplopia, such as: Weakness. Numbness. Headache. Eye pain. Clumsiness. Nausea. Drooping eyelids. Abnormal movement of one eye. Get help right away if: You have sudden vision loss. You suddenly get a very bad headache. You have sudden weakness or numbness. You develop droopiness on one side of your face. You suddenly lose the ability to speak, understand speech, or both. You develop difficulty breathing. These symptoms may represent a serious problem  that is an emergency. Do not wait to see if the symptoms will go away. Get medical help right away. Call your local emergency services (911 in the [place] Do not drive yourself to the hospital. Summary Diplopia is a condition in which a person sees two of a single object. It is also called double vision. Monocular diplopia is double vision that affects only one eye at a time. It is often  caused by irregularity in the surface layer of your eye (cornea), a clouding of the lens in your eye (cataract), or a problem in the way your eye focuses light. Binocular diplopia is double vision that affects both eyes at the same time. However, when you shut one eye, the double vision will go away. Binocular diplopia may be more serious. If you have diplopia, you may need to see a health care provider who specializes in eye conditions (ophthalmologist) or a nerve specialist (neurologist) to find the cause. This information is not intended to replace advice given to you by your health care provider. Make sure you discuss any questions you have with your health care provider. Document Revised: 12/22/2020 Document Reviewed: 12/22/2020 Elsevier Patient Education  2023 [employer].

## 2022-03-08 ENCOUNTER — Telehealth: Payer: Self-pay | Admitting: *Deleted

## 2022-03-08 NOTE — Telephone Encounter (Signed)
Received surgical clearance letter from [facility] re: EGD that has not been scheduled. Letter on NP's desk who is out of office until 03/12/22.

## 2022-03-13 NOTE — Telephone Encounter (Signed)
Signed and placed in outbox.  Thank you. ?

## 2022-03-13 NOTE — Telephone Encounter (Signed)
Surgery clearance letter faxed to [facility] re: EGD. Received confirmation.  Per NP: Can Hold Brilinta for 7 days with small but acceptable risk of stroke while off therapy. Low risk only from neurological standpoint. Form sent to medical records to be scanned.

## 2023-03-23 IMAGING — MR MR HEAD W/O CM
12 of 14 series · 40 of 48 positions shown · non-contrast
Comparison: CTs from 06/08/2021.

CLINICAL DATA: Follow-up examination for acute stroke.

EXAM:
MRI HEAD WITHOUT CONTRAST
TECHNIQUE: Multiplanar, multiecho pulse sequences of the brain and surrounding
structures were obtained without intravenous contrast.

[Series 5: DWI · axial · 3.0mm · 0.88mm/px · z∈[-60,+96]mm · 6 of 107 slices shown (1 of 4)]
[im 1/107]
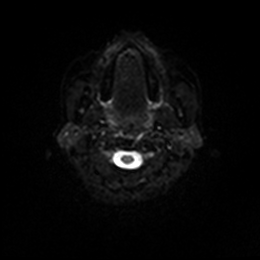
[im 22/107]
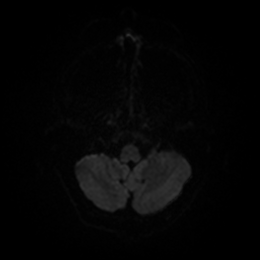
[im 43/107]
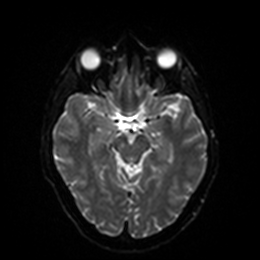
[im 64/107]
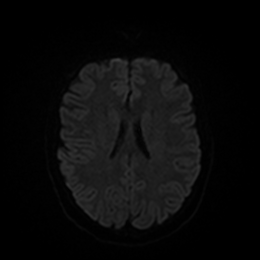
[im 85/107]
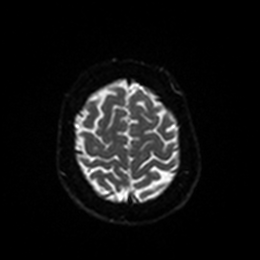
[im 107/107]
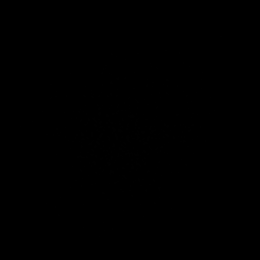

[Series 6: DWI · axial · 3.0mm · 0.88mm/px · z∈[-60,+93]mm · 3 of 53 slices shown (2 of 4)]
[im 1/53]
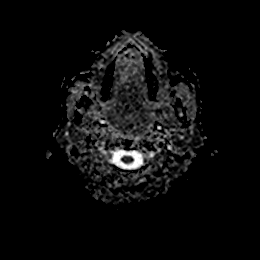
[im 27/53]
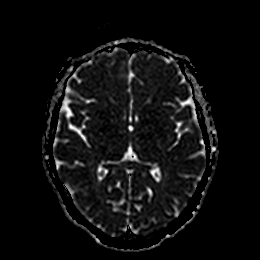
[im 53/53]
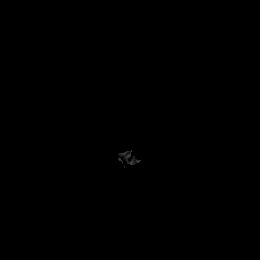

[Series 7: DWI · coronal · 4.0mm · 0.88mm/px · 5 of 76 slices shown (3 of 4)]
[im 1/76]
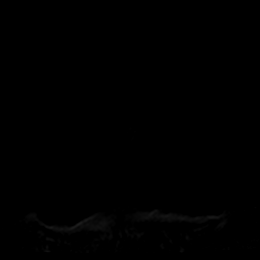
[im 19/76]
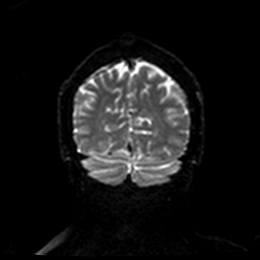
[im 38/76]
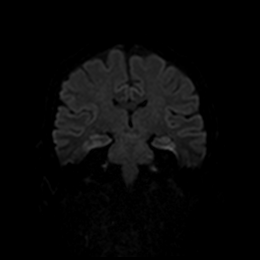
[im 57/76]
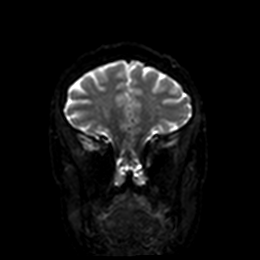
[im 76/76]
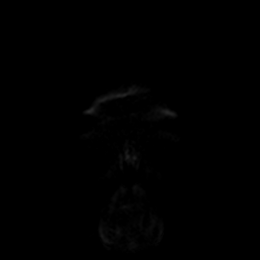

[Series 8: DWI · coronal · 4.0mm · 0.88mm/px · 3 of 38 slices shown (4 of 4)]
[im 1/38]
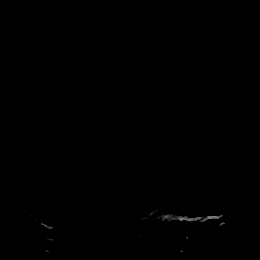
[im 19/38]
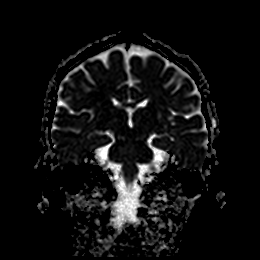
[im 38/38]
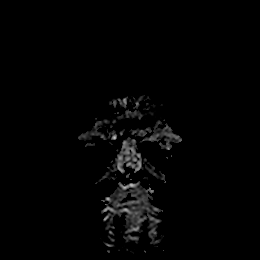

[Series 9: T1 · sagittal · 5.0mm · 0.75mm/px · 2 of 28 slices shown]
[im 1/28]
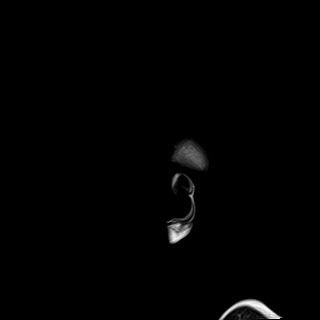
[im 28/28]
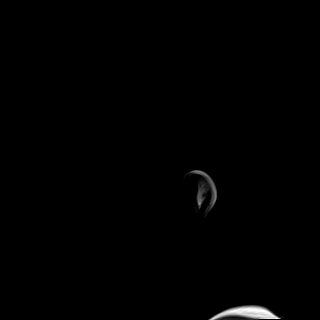

[Series 10: T2 · axial · 5.0mm · 0.72mm/px · z∈[-63,+96]mm · 2 of 28 slices shown (1 of 2)]
[im 1/28]
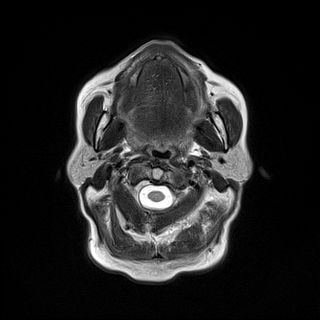
[im 28/28]
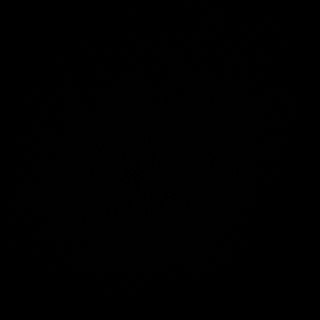

[Series 11: FLAIR · axial · 5.0mm · 0.45mm/px · z∈[-64,+95]mm · 2 of 28 slices shown]
[im 1/28]
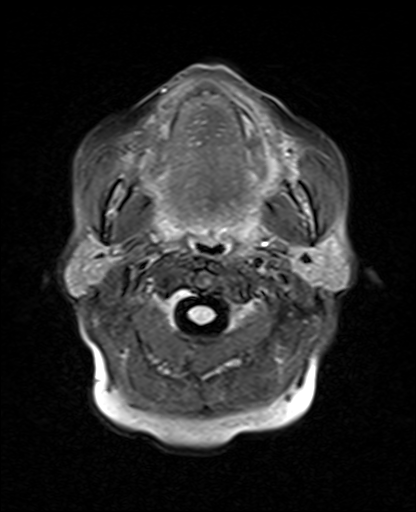
[im 28/28]
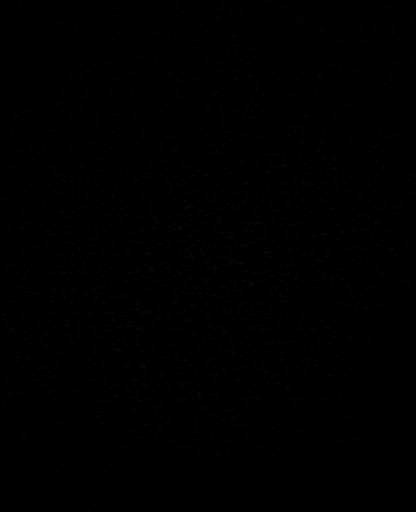

[Series 12: mag_images · axial · 3.0mm · 0.90mm/px · z∈[-65,+97]mm · 4 of 56 slices shown]
[im 1/56]
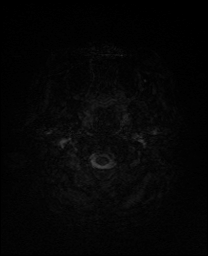
[im 19/56]
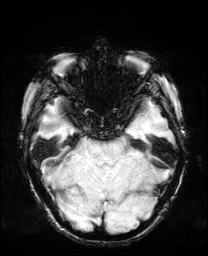
[im 37/56]
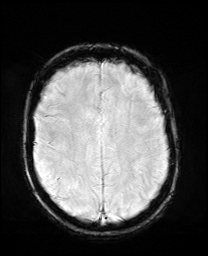
[im 56/56]
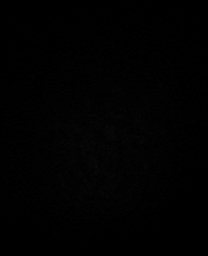

[Series 13: pha_images · axial · 3.0mm · 0.90mm/px · z∈[-65,+88]mm · 4 of 53 slices shown]
[im 1/53]
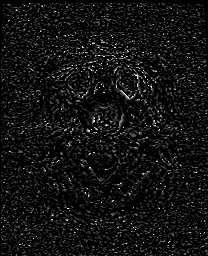
[im 18/53]
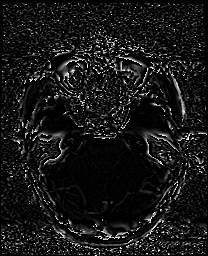
[im 35/53]
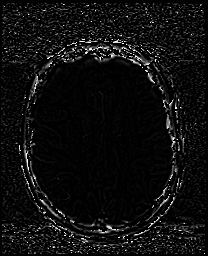
[im 53/53]
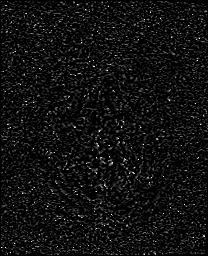

[Series 14: swi_images · axial · 3.0mm · 0.90mm/px · z∈[-65,+97]mm · 4 of 56 slices shown]
[im 1/56]
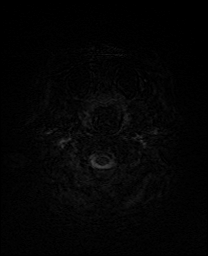
[im 19/56]
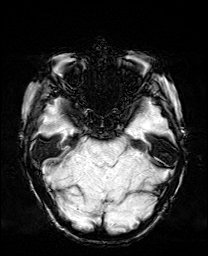
[im 37/56]
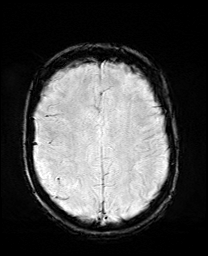
[im 56/56]
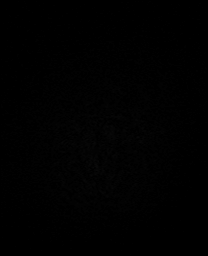

[Series 15: mip_images(sw) · axial · 24.0mm · 0.90mm/px · z∈[-54,+87]mm · 3 of 49 slices shown]
[im 1/49]
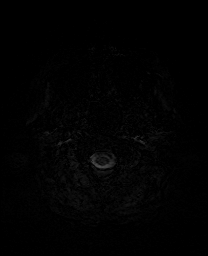
[im 25/49]
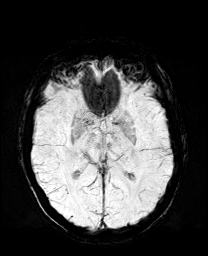
[im 49/49]
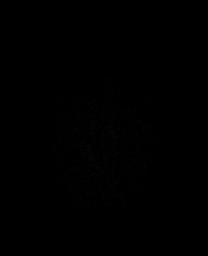

[Series 17: T2 · coronal · 5.0mm · 0.34mm/px · 2 of 33 slices shown (2 of 2)]
[im 1/33]
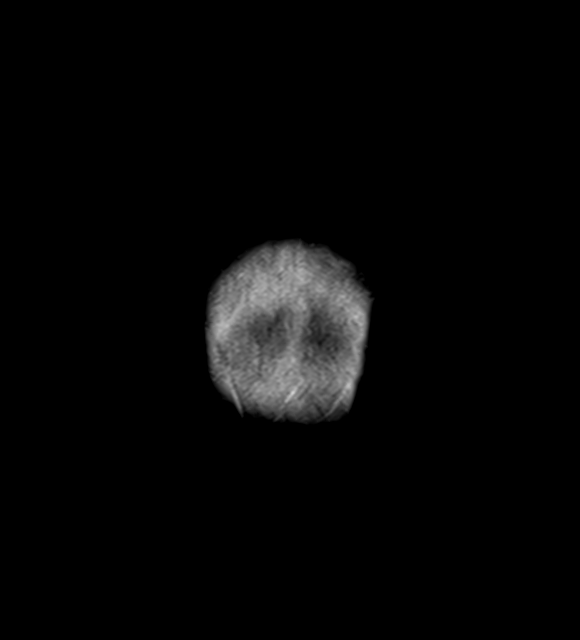
[im 33/33]
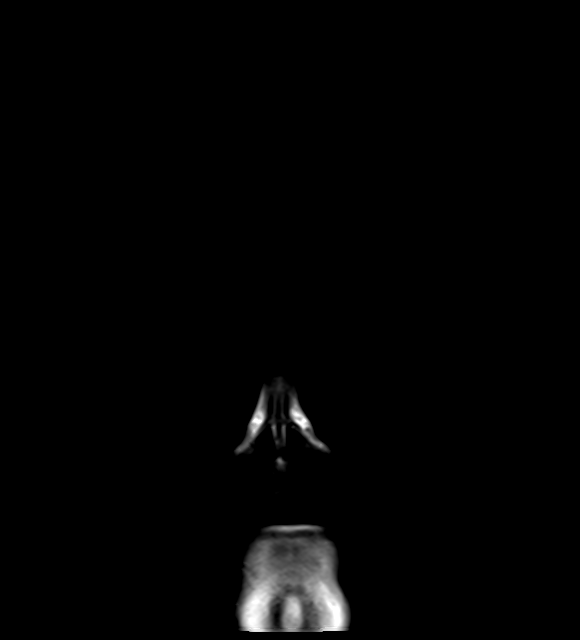

[40 of 48 positions shown; findings below may reference images not displayed]

FINDINGS: Brain: Cerebral volume within normal limits for age. Scattered
patchy T2/FLAIR hyperintensity present within the periventricular
deep white matter both cerebral hemispheres as well as the pons,
most consistent with chronic small vessel ischemic disease, mild in
nature.

No abnormal foci of restricted diffusion to suggest acute or
subacute ischemia. Gray-white matter differentiation maintained. No
encephalomalacia to suggest chronic cortical infarction. No evidence
for acute or chronic intracranial hemorrhage.

No mass lesion, midline shift or mass effect. No hydrocephalus or
extra-axial fluid collection. Pituitary gland suprasellar region
normal. Midline structures intact.

Vascular: Major intracranial vascular flow voids are maintained.

Skull and upper cervical spine: Craniocervical junction within
normal limits. Bone marrow signal intensity normal. No scalp soft
tissue abnormality.

Sinuses/Orbits: Prior bilateral ocular lens replacement. Paranasal
sinuses are largely clear. No mastoid effusion. Inner ear structures
grossly normal.

Other: None.
IMPRESSION: 1. No acute intracranial abnormality.
2. Mild chronic microvascular ischemic disease for age.

## 2023-03-23 IMAGING — MR MR HEAD W/O CM
12 of 13 series · 44 of 48 positions shown · non-contrast
Comparison: Brain MRI 06/09/2021. Noncontrast head CT and CT
angiogram head/neck 06/08/2021.

CLINICAL DATA: Stroke, follow-up. Additional history provided:
Stroke follow-up status Zavier Castrejon.

EXAM:
MRI HEAD WITHOUT CONTRAST
TECHNIQUE: Multiplanar, multiecho pulse sequences of the brain and surrounding
structures were obtained without intravenous contrast.

[Series 5: DWI · axial · 3.0mm · 0.88mm/px · z∈[-81,+71]mm · 8 of 104 slices shown (1 of 4)]
[im 1/104]
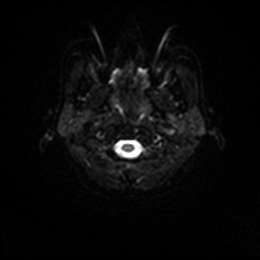
[im 15/104]
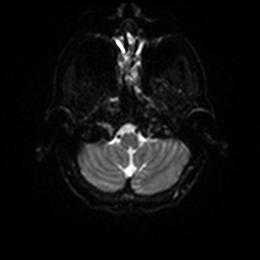
[im 30/104]
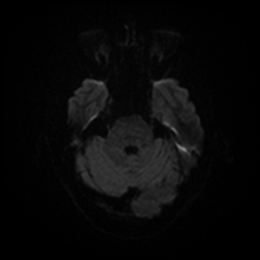
[im 45/104]
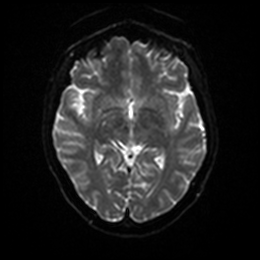
[im 59/104]
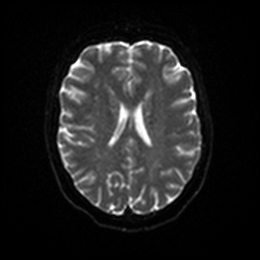
[im 74/104]
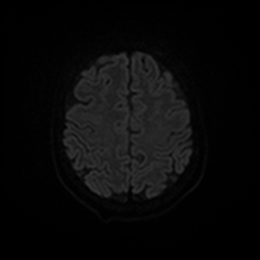
[im 89/104]
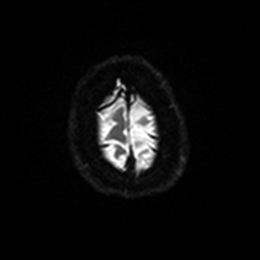
[im 104/104]
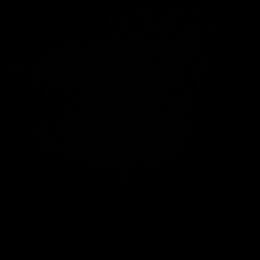

[Series 6: DWI · axial · 3.0mm · 0.88mm/px · z∈[-81,+68]mm · 4 of 51 slices shown (2 of 4)]
[im 1/51]
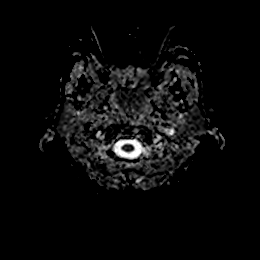
[im 17/51]
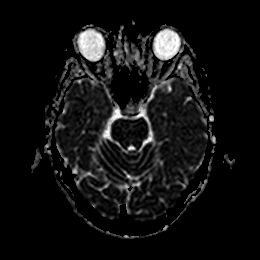
[im 34/51]
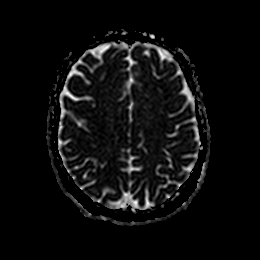
[im 51/51]
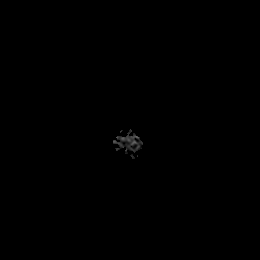

[Series 7: DWI · coronal · 4.0mm · 0.88mm/px · 5 of 72 slices shown (3 of 4)]
[im 1/72]
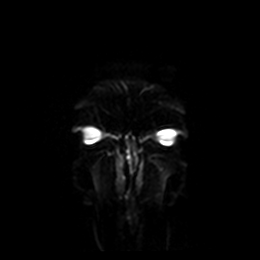
[im 18/72]
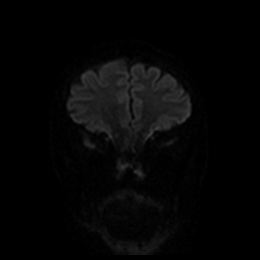
[im 36/72]
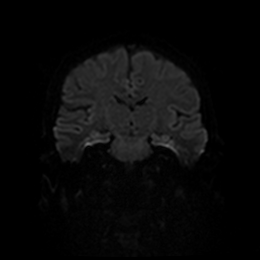
[im 54/72]
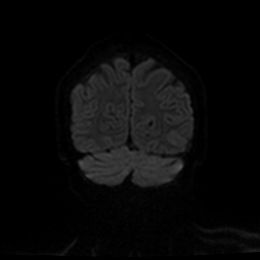
[im 72/72]
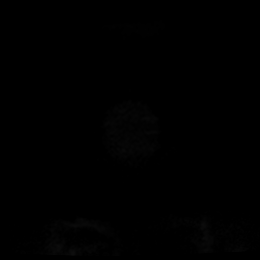

[Series 8: DWI · coronal · 4.0mm · 0.88mm/px · 3 of 36 slices shown (4 of 4)]
[im 1/36]
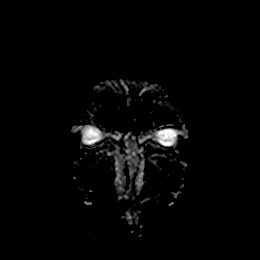
[im 18/36]
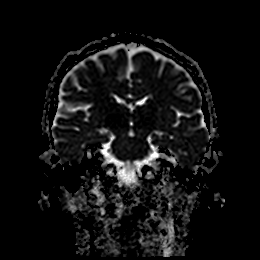
[im 36/36]
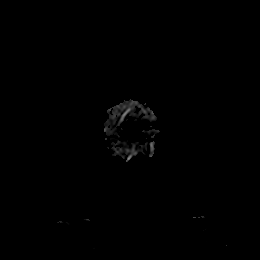

[Series 9: T1 · sagittal · 5.0mm · 0.75mm/px · 2 of 23 slices shown]
[im 1/23]
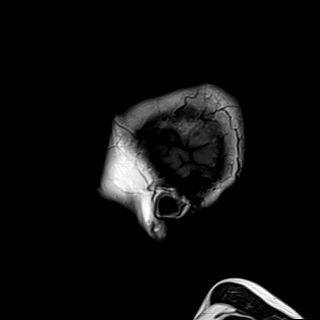
[im 23/23]
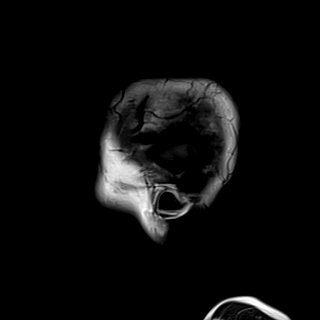

[Series 10: T2 · axial · 5.0mm · 0.72mm/px · z∈[-85,+58]mm · 2 of 25 slices shown (1 of 2)]
[im 1/25]
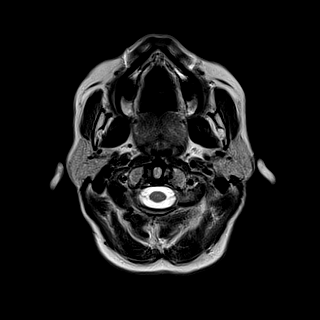
[im 25/25]
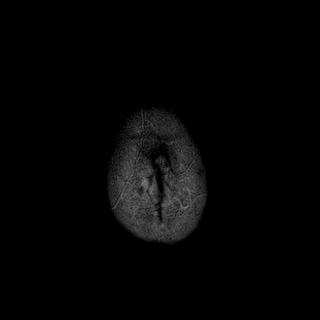

[Series 11: FLAIR · axial · 5.0mm · 0.45mm/px · z∈[-87,+56]mm · 2 of 25 slices shown]
[im 1/25]
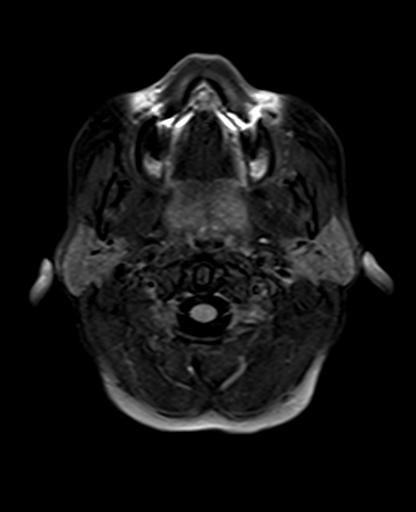
[im 25/25]
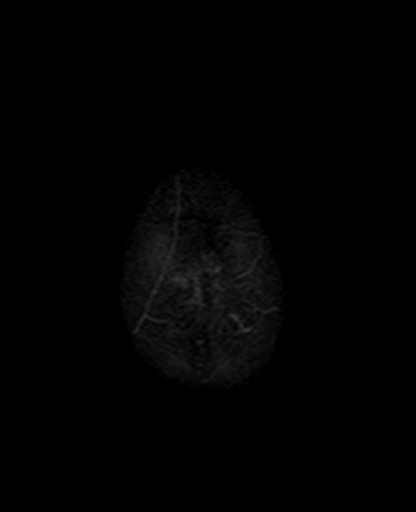

[Series 12: mag_images · axial · 3.0mm · 0.90mm/px · z∈[-95,+81]mm · 4 of 60 slices shown]
[im 1/60]
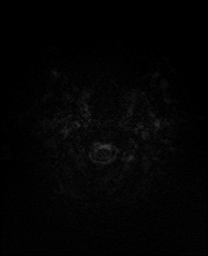
[im 20/60]
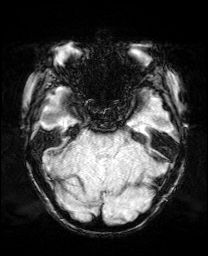
[im 40/60]
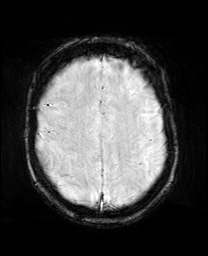
[im 60/60]
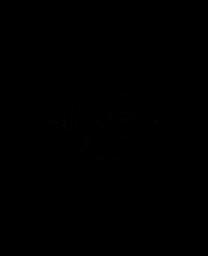

[Series 13: pha_images · axial · 3.0mm · 0.90mm/px · z∈[-95,+72]mm · 4 of 55 slices shown]
[im 1/55]
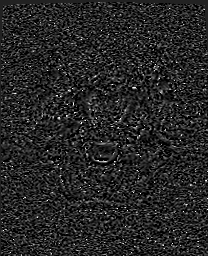
[im 19/55]
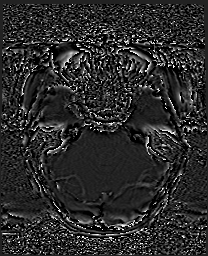
[im 37/55]
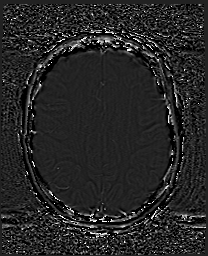
[im 55/55]
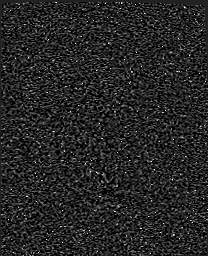

[Series 14: swi_images · axial · 3.0mm · 0.90mm/px · z∈[-95,+81]mm · 4 of 60 slices shown]
[im 1/60]
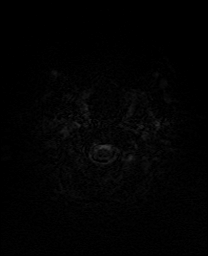
[im 20/60]
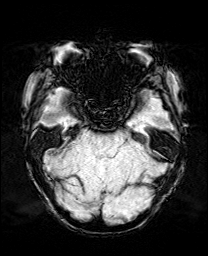
[im 40/60]
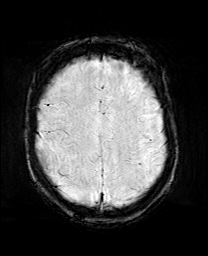
[im 60/60]
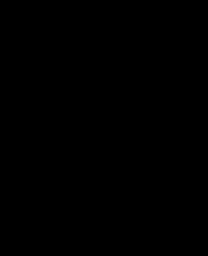

[Series 15: mip_images(sw) · axial · 24.0mm · 0.90mm/px · z∈[-85,+70]mm · 4 of 53 slices shown]
[im 1/53]
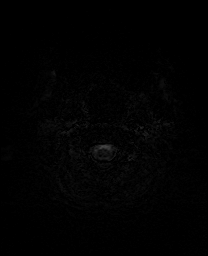
[im 18/53]
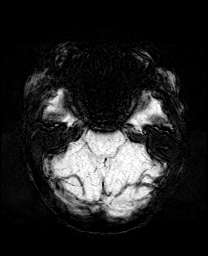
[im 35/53]
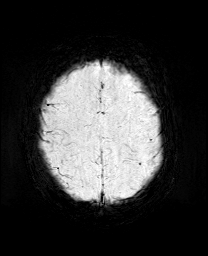
[im 53/53]
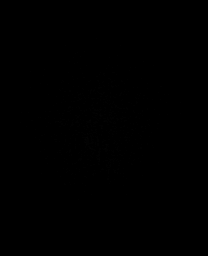

[Series 17: T2 · coronal · 5.0mm · 0.34mm/px · 2 of 29 slices shown (2 of 2)]
[im 1/29]
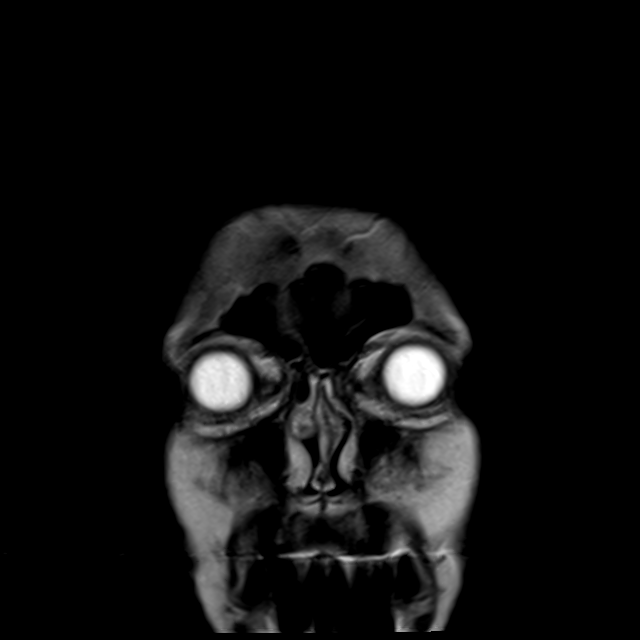
[im 29/29]
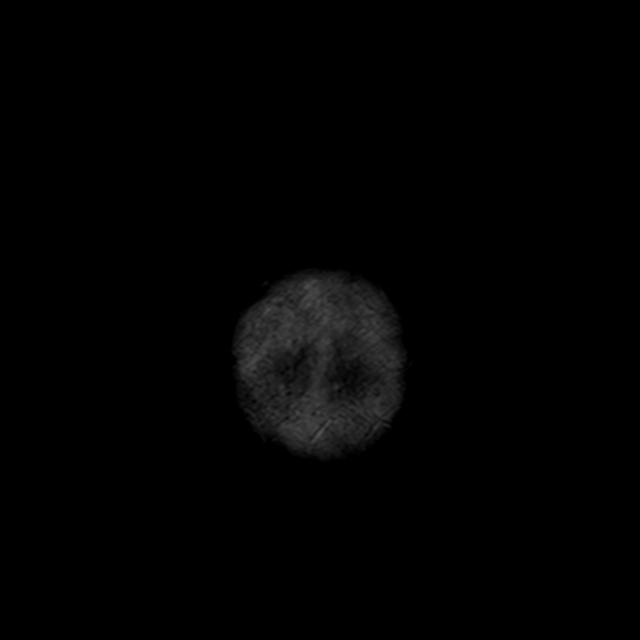

[44 of 48 positions shown; findings below may reference images not displayed]

FINDINGS: Brain:

Mild intermittent motion degradation.

Cerebral volume is normal.

Redemonstrated small chronic lacunar infarct at the junction of the
left lentiform nucleus and posterior limb of left internal capsule
(series 11, image 14) (series 10, image 14).

Mild multifocal T2 FLAIR hyperintense signal abnormality elsewhere
within the cerebral white matter and pons, nonspecific but
compatible with chronic small vessel ischemic disease.

Punctate chronic microhemorrhages within the left parietal lobe,
unchanged.

There is no acute infarct.

No evidence of an intracranial mass.

No extra-axial fluid collection.

No midline shift.

Vascular: Maintained flow voids within the proximal large arterial
vessels.

Skull and upper cervical spine: No focal suspicious marrow lesion.
Incompletely assessed cervical spondylosis.

Sinuses/Orbits: Visualized orbits show no acute finding. No
significant paranasal sinus disease.
IMPRESSION: Mildly motion degraded exam.

No evidence of acute intracranial abnormality.

Redemonstrated chronic lacunar infarct at the junction of the left
caudate nucleus and posterior limb of left internal capsule.

Background mild chronic small vessel ischemic changes within the
cerebral white matter and pons.

## 2024-11-17 ENCOUNTER — Institutional Professional Consult (permissible substitution): Admitting: Neurology
